# Patient Record
Sex: Male | Born: 1955 | Race: White | Hispanic: No | Marital: Married | Smoking: Never smoker
Health system: Southern US, Community
[De-identification: ages and names within clinical notes are randomized; demographics above are authoritative.]

## PROBLEM LIST (undated history)

## (undated) DIAGNOSIS — I639 Cerebral infarction, unspecified: Secondary | ICD-10-CM

## (undated) DIAGNOSIS — I4892 Unspecified atrial flutter: Secondary | ICD-10-CM

## (undated) DIAGNOSIS — I480 Paroxysmal atrial fibrillation: Secondary | ICD-10-CM

## (undated) DIAGNOSIS — R609 Edema, unspecified: Secondary | ICD-10-CM

## (undated) DIAGNOSIS — I10 Essential (primary) hypertension: Secondary | ICD-10-CM

## (undated) DIAGNOSIS — E785 Hyperlipidemia, unspecified: Secondary | ICD-10-CM

## (undated) DIAGNOSIS — Z953 Presence of xenogenic heart valve: Secondary | ICD-10-CM

## (undated) DIAGNOSIS — Z79899 Other long term (current) drug therapy: Secondary | ICD-10-CM

## (undated) HISTORY — PX: HERNIA REPAIR: SHX51

## (undated) HISTORY — DX: Paroxysmal atrial fibrillation: I48.0

## (undated) HISTORY — PX: TOTAL HIP ARTHROPLASTY: SHX124

## (undated) HISTORY — DX: Other long term (current) drug therapy: Z79.899

## (undated) HISTORY — DX: Essential (primary) hypertension: I10

## (undated) HISTORY — DX: Cerebral infarction, unspecified: I63.9

## (undated) HISTORY — PX: SPLENECTOMY: SUR1306

## (undated) HISTORY — DX: Presence of xenogenic heart valve: Z95.3

## (undated) HISTORY — DX: Hyperlipidemia, unspecified: E78.5

## (undated) HISTORY — PX: APPENDECTOMY: SHX54

## (undated) HISTORY — DX: Unspecified atrial flutter: I48.92

## (undated) HISTORY — PX: COLON SURGERY: SHX602

## (undated) HISTORY — PX: AORTIC VALVE REPLACEMENT: SHX41

## (undated) HISTORY — DX: Edema, unspecified: R60.9

---

## 2008-06-17 ENCOUNTER — Ambulatory Visit: Payer: Self-pay | Admitting: *Deleted

## 2008-06-17 ENCOUNTER — Inpatient Hospital Stay (HOSPITAL_COMMUNITY): Admission: RE | Admit: 2008-06-17 | Discharge: 2008-06-24 | Payer: Self-pay | Admitting: *Deleted

## 2008-06-25 ENCOUNTER — Other Ambulatory Visit (HOSPITAL_COMMUNITY): Admission: RE | Admit: 2008-06-25 | Discharge: 2008-08-28 | Payer: Self-pay | Admitting: Psychiatry

## 2008-06-27 ENCOUNTER — Ambulatory Visit: Payer: Self-pay | Admitting: Psychiatry

## 2008-07-18 ENCOUNTER — Ambulatory Visit: Payer: Self-pay | Admitting: Psychiatry
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
## 2011-08-13 LAB — HEPATIC FUNCTION PANEL
ALT: 57 — ABNORMAL HIGH
AST: 32
Bilirubin, Direct: <0.1
Total Bilirubin: 0.6

## 2011-08-13 LAB — URINE DRUGS OF ABUSE SCREEN W ALC, ROUTINE (REF LAB)
Amphetamine Screen, Ur: NEGATIVE
Barbiturate Quant, Ur: NEGATIVE
Creatinine,U: 157.2
Ethyl Alcohol: <10
Marijuana Metabolite: POSITIVE — AB

## 2011-08-13 LAB — URINALYSIS, ROUTINE W REFLEX MICROSCOPIC
Bilirubin Urine: NEGATIVE
Hgb urine dipstick: NEGATIVE
Nitrite: NEGATIVE
Protein, ur: NEGATIVE
Specific Gravity, Urine: 1.013
Urobilinogen, UA: 0.2

## 2011-08-13 LAB — THC (MARIJUANA), URINE, CONFIRMATION: Marijuana, Ur-Confirmation: 43 ng/mL

## 2011-08-13 LAB — TSH: TSH: 0.562

## 2011-08-13 LAB — RPR: RPR Ser Ql: NONREACTIVE

## 2011-08-16 LAB — URINE DRUGS OF ABUSE SCREEN W ALC, ROUTINE (REF LAB)
Amphetamine Screen, Ur: NEGATIVE
Barbiturate Quant, Ur: NEGATIVE
Creatinine,U: 89.8
Ethyl Alcohol: <10
Marijuana Metabolite: NEGATIVE
Methadone: NEGATIVE

## 2011-08-17 LAB — URINE DRUGS OF ABUSE SCREEN W ALC, ROUTINE (REF LAB)
Amphetamine Screen, Ur: NEGATIVE
Barbiturate Quant, Ur: NEGATIVE
Creatinine,U: 94.3
Ethyl Alcohol: <10
Marijuana Metabolite: NEGATIVE
Phencyclidine (PCP): NEGATIVE

## 2011-08-18 LAB — URINE DRUGS OF ABUSE SCREEN W ALC, ROUTINE (REF LAB)
Barbiturate Quant, Ur: NEGATIVE
Barbiturate Quant, Ur: NEGATIVE
Benzodiazepines.: NEGATIVE
Benzodiazepines.: NEGATIVE
Cocaine Metabolites: NEGATIVE
Ethyl Alcohol: <10
Ethyl Alcohol: <10
Marijuana Metabolite: NEGATIVE
Methadone: NEGATIVE
Methadone: NEGATIVE
Phencyclidine (PCP): NEGATIVE
Phencyclidine (PCP): NEGATIVE
Phencyclidine (PCP): NEGATIVE
Propoxyphene: NEGATIVE

## 2011-08-18 LAB — BENZODIAZEPINE, QUANTITATIVE, URINE
Alprazolam (GC/LC/MS), ur confirm: NEGATIVE
Flurazepam GC/MS Conf: NEGATIVE
Oxazepam GC/MS Conf: NEGATIVE

## 2015-05-07 DIAGNOSIS — I480 Paroxysmal atrial fibrillation: Secondary | ICD-10-CM

## 2015-05-07 DIAGNOSIS — Z953 Presence of xenogenic heart valve: Secondary | ICD-10-CM

## 2015-05-07 HISTORY — DX: Presence of xenogenic heart valve: Z95.3

## 2015-05-07 HISTORY — DX: Paroxysmal atrial fibrillation: I48.0

## 2015-07-10 DIAGNOSIS — Z79899 Other long term (current) drug therapy: Secondary | ICD-10-CM

## 2015-07-10 HISTORY — DX: Other long term (current) drug therapy: Z79.899

## 2016-06-18 DIAGNOSIS — H4601 Optic papillitis, right eye: Secondary | ICD-10-CM | POA: Diagnosis not present

## 2017-07-21 DIAGNOSIS — I4892 Unspecified atrial flutter: Secondary | ICD-10-CM

## 2017-07-21 DIAGNOSIS — E785 Hyperlipidemia, unspecified: Secondary | ICD-10-CM

## 2017-07-21 DIAGNOSIS — I10 Essential (primary) hypertension: Secondary | ICD-10-CM

## 2017-07-21 DIAGNOSIS — I639 Cerebral infarction, unspecified: Secondary | ICD-10-CM

## 2017-07-21 DIAGNOSIS — R609 Edema, unspecified: Secondary | ICD-10-CM

## 2017-07-21 HISTORY — DX: Hyperlipidemia, unspecified: E78.5

## 2017-07-21 HISTORY — DX: Edema, unspecified: R60.9

## 2017-07-21 HISTORY — DX: Unspecified atrial flutter: I48.92

## 2017-07-21 HISTORY — DX: Essential (primary) hypertension: I10

## 2017-07-21 HISTORY — DX: Cerebral infarction, unspecified: I63.9

## 2017-08-03 DIAGNOSIS — I350 Nonrheumatic aortic (valve) stenosis: Secondary | ICD-10-CM | POA: Insufficient documentation

## 2017-08-03 DIAGNOSIS — Q231 Congenital insufficiency of aortic valve: Secondary | ICD-10-CM | POA: Insufficient documentation

## 2017-08-25 ENCOUNTER — Ambulatory Visit: Payer: Self-pay | Admitting: Cardiology

## 2017-08-26 ENCOUNTER — Ambulatory Visit: Payer: Self-pay | Admitting: Cardiology

## 2017-09-06 ENCOUNTER — Other Ambulatory Visit: Payer: Self-pay | Admitting: *Deleted

## 2017-09-16 ENCOUNTER — Ambulatory Visit: Payer: Self-pay | Admitting: Cardiology

## 2017-09-19 ENCOUNTER — Emergency Department (HOSPITAL_COMMUNITY)
Admission: EM | Admit: 2017-09-19 | Discharge: 2017-09-20 | Disposition: A | Discharge status: Home / Self Care | Payer: BLUE CROSS/BLUE SHIELD | Attending: Emergency Medicine | Admitting: Emergency Medicine

## 2017-09-19 ENCOUNTER — Encounter (HOSPITAL_COMMUNITY): Payer: Self-pay

## 2017-09-19 ENCOUNTER — Emergency Department (HOSPITAL_COMMUNITY): Payer: BLUE CROSS/BLUE SHIELD

## 2017-09-19 DIAGNOSIS — Z8673 Personal history of transient ischemic attack (TIA), and cerebral infarction without residual deficits: Secondary | ICD-10-CM | POA: Diagnosis not present

## 2017-09-19 DIAGNOSIS — Z79899 Other long term (current) drug therapy: Secondary | ICD-10-CM | POA: Insufficient documentation

## 2017-09-19 DIAGNOSIS — Z7901 Long term (current) use of anticoagulants: Secondary | ICD-10-CM | POA: Insufficient documentation

## 2017-09-19 DIAGNOSIS — H539 Unspecified visual disturbance: Secondary | ICD-10-CM | POA: Insufficient documentation

## 2017-09-19 DIAGNOSIS — I639 Cerebral infarction, unspecified: Secondary | ICD-10-CM | POA: Diagnosis not present

## 2017-09-19 DIAGNOSIS — I10 Essential (primary) hypertension: Secondary | ICD-10-CM | POA: Insufficient documentation

## 2017-09-19 DIAGNOSIS — H4602 Optic papillitis, left eye: Secondary | ICD-10-CM | POA: Diagnosis not present

## 2017-09-19 LAB — COMPREHENSIVE METABOLIC PANEL
ALBUMIN: 3.5 g/dL (ref 3.5–5.0)
ALK PHOS: 51 U/L (ref 38–126)
ALT: 20 U/L (ref 17–63)
ANION GAP: 8 (ref 5–15)
AST: 29 U/L (ref 15–41)
BILIRUBIN TOTAL: 0.4 mg/dL (ref 0.3–1.2)
BUN: 23 mg/dL — AB (ref 6–20)
CO2: 20 mmol/L — AB (ref 22–32)
Calcium: 8.9 mg/dL (ref 8.9–10.3)
Chloride: 109 mmol/L (ref 101–111)
Creatinine, Ser: 1.61 mg/dL — ABNORMAL HIGH (ref 0.61–1.24)
GFR calc Af Amer: 51 mL/min — ABNORMAL LOW (ref >60)
GFR calc non Af Amer: 44 mL/min — ABNORMAL LOW (ref >60)
GLUCOSE: 164 mg/dL — AB (ref 65–99)
POTASSIUM: 4.3 mmol/L (ref 3.5–5.1)
SODIUM: 137 mmol/L (ref 135–145)
TOTAL PROTEIN: 6.7 g/dL (ref 6.5–8.1)

## 2017-09-19 LAB — CBC
HCT: 36.3 % — ABNORMAL LOW (ref 39.0–52.0)
HEMOGLOBIN: 11.9 g/dL — AB (ref 13.0–17.0)
MCH: 30.6 pg (ref 26.0–34.0)
MCHC: 32.8 g/dL (ref 30.0–36.0)
MCV: 93.3 fL (ref 78.0–100.0)
PLATELETS: 218 10*3/uL (ref 150–400)
RBC: 3.89 MIL/uL — AB (ref 4.22–5.81)
RDW: 14.8 % (ref 11.5–15.5)
WBC: 8.2 10*3/uL (ref 4.0–10.5)

## 2017-09-19 LAB — I-STAT CHEM 8, ED
BUN: 27 mg/dL — AB (ref 6–20)
CALCIUM ION: 1.19 mmol/L (ref 1.15–1.40)
CHLORIDE: 109 mmol/L (ref 101–111)
Creatinine, Ser: 1.7 mg/dL — ABNORMAL HIGH (ref 0.61–1.24)
Glucose, Bld: 169 mg/dL — ABNORMAL HIGH (ref 65–99)
HCT: 36 % — ABNORMAL LOW (ref 39.0–52.0)
Hemoglobin: 12.2 g/dL — ABNORMAL LOW (ref 13.0–17.0)
POTASSIUM: 4.2 mmol/L (ref 3.5–5.1)
SODIUM: 141 mmol/L (ref 135–145)
TCO2: 26 mmol/L (ref 22–32)

## 2017-09-19 LAB — DIFFERENTIAL
BASOS ABS: 0.1 10*3/uL (ref 0.0–0.1)
Basophils Relative: 1 %
EOS ABS: 0.2 10*3/uL (ref 0.0–0.7)
Eosinophils Relative: 2 %
LYMPHS ABS: 1.2 10*3/uL (ref 0.7–4.0)
LYMPHS PCT: 15 %
Monocytes Absolute: 0.4 10*3/uL (ref 0.1–1.0)
Monocytes Relative: 5 %
NEUTROS PCT: 77 %
Neutro Abs: 6.3 10*3/uL (ref 1.7–7.7)

## 2017-09-19 LAB — I-STAT TROPONIN, ED: Troponin i, poc: 0 ng/mL (ref 0.00–0.08)

## 2017-09-19 LAB — APTT: APTT: 37 s — AB (ref 24–36)

## 2017-09-19 LAB — PROTIME-INR
INR: 1.22
PROTHROMBIN TIME: 15.3 s — AB (ref 11.4–15.2)

## 2017-09-19 LAB — CBG MONITORING, ED: GLUCOSE-CAPILLARY: 159 mg/dL — AB (ref 65–99)

## 2017-09-19 NOTE — ED Provider Notes (Signed)
[facility] Provider Note   CSN: [id number] Arrival date & time: 09/19/17  1710     History   Chief Complaint Chief Complaint  Patient presents with  . Visual Field Change    HPI Lawrence Proctor is a 61 y.o. male who presents with left sided peripheral vision loss. PMH significant for A. 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 The patient states that several days ago he noticed his vision was decreased. He was walking his dog and when he looked down he couldn't see the dog. He went to [facility] the other day and kept running in to people because he couldn't see them. He also sees "splotches of color" at times which go away spontaneously. He has had difficulty reading and difficulty driving due to vision loss. He states that he has permanent peripheral and lower vision loss on the right side due to retinal artery occlusion last year. No headache, eye pain, eye redness, drainage. No extremity weakness or facial droop.  HPI  Past Medical History:  Diagnosis Date  . Atrial flutter ([place]) 07/21/2017  . Edema 07/21/2017  . Hyperlipidemia 07/21/2017  . Hypertension 07/21/2017  . On amiodarone therapy 07/10/2015   Overview:  CHADS2 vasc score=1  . Paroxysmal atrial fibrillation ([place]) 05/07/2015  . S/P aortic valve replacement with tissue 05/07/2015  . Stroke (cerebrum) ([place]) 07/21/2017    Patient Active Problem List   Diagnosis Date Noted  . Aortic stenosis, severe 08/03/2017  . Bicuspid aortic valve 08/03/2017  . Atrial flutter ([place]) 07/21/2017  . Edema 07/21/2017  . Hyperlipidemia 07/21/2017  . Hypertension 07/21/2017  . Stroke (cerebrum) ([place]) 07/21/2017  . On amiodarone therapy 07/10/2015  . Paroxysmal atrial fibrillation ([place]) 05/07/2015  . S/P aortic valve replacement with tissue 05/07/2015    Past Surgical History:  Procedure Laterality Date  . AORTIC VALVE REPLACEMENT     tissue   . APPENDECTOMY      . COLON SURGERY    . HERNIA REPAIR    . SPLENECTOMY    . TOTAL HIP ARTHROPLASTY Right        Home Medications    Prior to Admission medications   Medication Sig Start Date End Date Taking? Authorizing Provider  allopurinol (ZYLOPRIM) 300 MG tablet Take 300 mg daily by mouth.    Yes [provider]  amiodarone (PACERONE) 200 MG tablet Take 200 mg daily by mouth.  07/11/17  Yes [provider]  amoxicillin (AMOXIL) 500 MG tablet Take 500 mg 3 (three) times daily by mouth. 09/12/17  Yes [provider]  apixaban (ELIQUIS) 5 MG TABS tablet Take 5 mg 2 (two) times daily by mouth.  07/11/17 07/11/18 Yes [provider]  hydrALAZINE (APRESOLINE) 10 MG tablet Take 10 mg 2 (two) times daily by mouth.    Yes [provider]  lisinopril (PRINIVIL,ZESTRIL) 40 MG tablet Take 40 mg daily by mouth.  04/21/15  Yes [provider]  ofloxacin (OCUFLOX) 0.3 % ophthalmic solution Place 2 drops 4 (four) times daily into the left ear. 09/13/17  Yes [provider]  simvastatin (ZOCOR) 20 MG tablet Take 20 mg daily by mouth.    Yes [provider]  traMADol (ULTRAM) 50 MG tablet Take 50 mg every 6 (six) hours as needed by mouth for moderate pain.    Yes [provider]  traZODone (DESYREL) 50 MG tablet Take 50 mg at bedtime as needed  by mouth for sleep.    Yes [provider]    Family History Family History  Problem Relation Age of Onset  . Stroke Mother   . Heart attack Mother   . Heart attack Father   . Aortic stenosis Brother   . Hypertension Son     Social History Social History   Tobacco Use  . Smoking status: Never Smoker  . Smokeless tobacco: Never Used  Substance Use Topics  . Alcohol use: No  . Drug use: No     Allergies   Lorazepam   Review of Systems Review of Systems  Constitutional: Negative for fever.  Eyes: Positive for visual disturbance. Negative for photophobia, pain, discharge and  redness.  Respiratory: Negative for shortness of breath.   Cardiovascular: Negative for chest pain.  Neurological: Negative for syncope and headaches.  All other systems reviewed and are negative.    Physical Exam Updated Vital Signs BP (!) 149/82 (BP Location: Left Arm)   Pulse (!) 54   Temp 97.8 F (36.6 C) (Oral)   Resp 18   Ht 5\' 5"  (1.651 m)   Wt 81.2 kg (179 lb)   SpO2 98%   BMI 29.79 kg/m   Physical Exam  Constitutional: He is oriented to person, place, and time. He appears well-developed and well-nourished. No distress.  HENT:  Head: Normocephalic and atraumatic.  Eyes: Conjunctivae and EOM are normal. Pupils are equal, round, and reactive to light. Right eye exhibits no discharge. Left eye exhibits no discharge. Right conjunctiva is not injected. Left conjunctiva is not injected. No scleral icterus.  Visual acuity bilaterally: 20/30 Right side: 20/50 Left side: 20/30  Neck: Normal range of motion.  Cardiovascular: Normal rate and regular rhythm. Exam reveals no gallop and no friction rub.  No murmur heard. Pulmonary/Chest: Effort normal and breath sounds normal. No stridor. No respiratory distress. He has no wheezes. He has no rales. He exhibits no tenderness.  Abdominal: He exhibits no distension.  Neurological: He is alert and oriented to person, place, and time.  Skin: Skin is warm and dry.  Psychiatric: He has a normal mood and affect. His behavior is normal.  Nursing note and vitals reviewed.    ED Treatments / Results  Labs (all labs ordered are listed, but only abnormal results are displayed) Labs Reviewed  PROTIME-INR - Abnormal; Notable for the following components:      Result Value   Prothrombin Time 15.3 (*)    All other components within normal limits  APTT - Abnormal; Notable for the following components:   aPTT 37 (*)    All other components within normal limits  CBC - Abnormal; Notable for the following components:   RBC 3.89 (*)     Hemoglobin 11.9 (*)    HCT 36.3 (*)    All other components within normal limits  COMPREHENSIVE METABOLIC PANEL - Abnormal; Notable for the following components:   CO2 20 (*)    Glucose, Bld 164 (*)    BUN 23 (*)    Creatinine, Ser 1.61 (*)    GFR calc non Af Amer 44 (*)    GFR calc Af Amer 51 (*)    All other components within normal limits  CBG MONITORING, ED - Abnormal; Notable for the following components:   Glucose-Capillary 159 (*)    All other components within normal limits  I-STAT CHEM 8, ED - Abnormal; Notable for the following components:   BUN 27 (*)    Creatinine,  Ser 1.70 (*)    Glucose, Bld 169 (*)    Hemoglobin 12.2 (*)    HCT 36.0 (*)    All other components within normal limits  DIFFERENTIAL  I-STAT TROPONIN, ED    EKG  EKG Interpretation  Date/Time:  Monday September 19 2017 17:43:44 EST Ventricular Rate:  62 PR Interval:  196 QRS Duration: 94 QT Interval:  458 QTC Calculation: 464 R Axis:   13 Text Interpretation:  Normal sinus rhythm Cannot rule out Anterior infarct , age undetermined Abnormal ECG No old tracing to compare Confirmed by Ward, Cyril Mourning [id number]) on 09/19/2017 11:31:45 PM       Radiology Ct Head Wo Contrast  Result Date: 09/19/2017 CLINICAL DATA:  61 year old with right eye visual disturbances that began 2 days ago and left eye visual disturbances that began today. Current history of hypertension. EXAM: CT HEAD WITHOUT CONTRAST TECHNIQUE: Contiguous axial images were obtained from the base of the skull through the vertex without intravenous contrast. COMPARISON:  MRI brain 06/18/2016. FINDINGS: Brain: Ventricular system normal in size and appearance for age. No mass lesion. No midline shift. No acute hemorrhage or hematoma. No extra-axial fluid collections. No evidence of acute infarction. No focal brain parenchymal abnormalities. Vascular: Moderate bilateral carotid siphon and bilateral vertebral artery atherosclerosis. No hyperdense vessel.  Skull: No skull fracture or other focal osseous abnormality involving the skull. Sinuses/Orbits: Mucous retention cyst or polyp involving the left maxillary sinus as noted on the prior MRI. Opacification of scattered bilateral ethmoid air cells. Remaining visualized paranasal sinuses, bilateral mastoid air cells and bilateral middle ear cavities well-aerated. Visualized orbits and globes normal in appearance. Other: None. IMPRESSION: No acute intracranial abnormalities. Electronically Signed   By: Evangeline Dakin M.D.   On: 09/19/2017 21:15   Mr Brain Wo Contrast  Result Date: 09/20/2017 CLINICAL DATA:  LEFT eye peripheral vision loss. Assess for stroke. History of hypertension, hyperlipidemia, atrial fibrillation and stroke. EXAM: MRI HEAD AND ORBITS WITHOUT CONTRAST TECHNIQUE: Multiplanar, multiecho pulse sequences of the brain and surrounding structures were obtained without intravenous contrast. Multiplanar, multiecho pulse sequences of the orbits and surrounding structures were obtained including fat saturation techniques, without intravenous contrast administration. COMPARISON:  CT HEAD September 19, 2017 and MRI of the head August 4th 2017 FINDINGS: MRI HEAD FINDINGS BRAIN: No reduced diffusion to suggest acute ischemia. RIGHT parietal and RIGHT cerebellar chronic microhemorrhages. The ventricles and sulci are normal for patient's age. A few scattered supratentorial FLAIR T2 hyperintensities, normal for age. Small focus LEFT parietal encephalomalacia. No suspicious parenchymal signal, mass or mass effect. No abnormal extra-axial fluid collections. VASCULAR: Normal major intracranial vascular flow voids present at skull base. SKULL AND UPPER CERVICAL SPINE: No abnormal sellar expansion. No suspicious calvarial bone marrow signal. Craniocervical junction maintained. OTHER: None. MRI ORBITS FINDINGS ORBITS: Ocular globes are intact with normal signal. Lenses are located. Preservation of the orbital fat.  Normal appearance of the optic nerves and nerve sheaths. Normal symmetric appearance of the extraocular muscles. No intra-ocular mass, signal abnormality. Superior ophthalmic veins are not enlarged. VISUALIZED SINUSES: LEFT mastoid effusion. Small LEFT maxillary mucosal retention cyst. Mild paranasal sinus mucosal thickening without air-fluid levels. SOFT TISSUES: Normal. LIMITED INTRACRANIAL: Normal. IMPRESSION: MRI HEAD: 1. No acute intracranial process. 2. Stable examination including mild chronic small vessel ischemic disease and small old LEFT parietal infarct. MRI ORBITS: 1. Normal noncontrast MRI of the orbits. Electronically Signed   By: Elon Alas M.D.   On: 09/20/2017 03:56   Mr Darnelle Catalan  Wo Contrast  Result Date: 09/20/2017 CLINICAL DATA:  LEFT eye peripheral vision loss. Assess for stroke. History of hypertension, hyperlipidemia, atrial fibrillation and stroke. EXAM: MRI HEAD AND ORBITS WITHOUT CONTRAST TECHNIQUE: Multiplanar, multiecho pulse sequences of the brain and surrounding structures were obtained without intravenous contrast. Multiplanar, multiecho pulse sequences of the orbits and surrounding structures were obtained including fat saturation techniques, without intravenous contrast administration. COMPARISON:  CT HEAD September 19, 2017 and MRI of the head August 4th 2017 FINDINGS: MRI HEAD FINDINGS BRAIN: No reduced diffusion to suggest acute ischemia. RIGHT parietal and RIGHT cerebellar chronic microhemorrhages. The ventricles and sulci are normal for patient's age. A few scattered supratentorial FLAIR T2 hyperintensities, normal for age. Small focus LEFT parietal encephalomalacia. No suspicious parenchymal signal, mass or mass effect. No abnormal extra-axial fluid collections. VASCULAR: Normal major intracranial vascular flow voids present at skull base. SKULL AND UPPER CERVICAL SPINE: No abnormal sellar expansion. No suspicious calvarial bone marrow signal. Craniocervical junction  maintained. OTHER: None. MRI ORBITS FINDINGS ORBITS: Ocular globes are intact with normal signal. Lenses are located. Preservation of the orbital fat. Normal appearance of the optic nerves and nerve sheaths. Normal symmetric appearance of the extraocular muscles. No intra-ocular mass, signal abnormality. Superior ophthalmic veins are not enlarged. VISUALIZED SINUSES: LEFT mastoid effusion. Small LEFT maxillary mucosal retention cyst. Mild paranasal sinus mucosal thickening without air-fluid levels. SOFT TISSUES: Normal. LIMITED INTRACRANIAL: Normal. IMPRESSION: MRI HEAD: 1. No acute intracranial process. 2. Stable examination including mild chronic small vessel ischemic disease and small old LEFT parietal infarct. MRI ORBITS: 1. Normal noncontrast MRI of the orbits. Electronically Signed   By: Elon Alas M.D.   On: 09/20/2017 03:56    Procedures Procedures (including critical care time)  Medications Ordered in ED Medications  apixaban (ELIQUIS) tablet 5 mg (5 mg Oral Given 09/20/17 0031)  lisinopril (PRINIVIL,ZESTRIL) tablet 40 mg (40 mg Oral Given 09/20/17 0031)  hydrALAZINE (APRESOLINE) tablet 10 mg (10 mg Oral Given 09/20/17 0031)     Initial Impression / Assessment and Plan / ED Course  I have reviewed the triage vital signs and the nursing notes.  Pertinent labs & imaging results that were available during my care of the patient were reviewed by me and considered in my medical decision making (see chart for details).  61 year old male who presents with left-sided peripheral vision loss.  Visual acuity is decreased but intact.  He is hypertensive otherwise vital signs are normal.  Discussed with Dr. Leonides Schanz.  Will consult neurology and ophthalmology for recommendations.  Spoke with Dr. Cheral Marker with neurology who recommends MRI of brain and orbits.  Spoke with Dr. Manuella Ghazi with ophthalmology who recommends follow-up in his office in the morning after MRI.  MRI does not show any acute findings.   Discussed results with patient and his wife who is a [employer] scientist and advised him to follow-up with Dr. Manuella Ghazi this morning at 8 AM in his clinic.  The MRI report was printed out for him and directions were provided.  Final Clinical Impressions(s) / ED Diagnoses   Final diagnoses:  Visual disturbance    ED Discharge Orders    None       Recardo Evangelist, PA-C 09/20/17 Elizabeth, Delice Bison, DO 09/20/17 [phone]

## 2017-09-19 NOTE — ED Triage Notes (Signed)
Pt has vision loss in the left eye that began on Sunday. Pt alert and oriented. Sent here by his ophthalmologist for possible stroke. Vision lost is peripheral per opthalmology noted. Pt has hx of afib and currently taking eloquis.

## 2017-09-20 ENCOUNTER — Emergency Department (HOSPITAL_COMMUNITY): Payer: BLUE CROSS/BLUE SHIELD

## 2017-09-20 DIAGNOSIS — H47012 Ischemic optic neuropathy, left eye: Secondary | ICD-10-CM | POA: Diagnosis not present

## 2017-09-20 DIAGNOSIS — I639 Cerebral infarction, unspecified: Secondary | ICD-10-CM | POA: Diagnosis not present

## 2017-09-20 MED ORDER — APIXABAN 5 MG PO TABS
5.0000 mg | ORAL_TABLET | Freq: Two times a day (BID) | ORAL | Status: DC
  Administered 2017-09-20: 5 mg via ORAL
  Filled 2017-09-20 (×2): qty 1

## 2017-09-20 MED ORDER — HYDRALAZINE HCL 10 MG PO TABS
10.0000 mg | ORAL_TABLET | Freq: Once | ORAL | Status: AC
  Administered 2017-09-20: 10 mg via ORAL
  Filled 2017-09-20: qty 1

## 2017-09-20 MED ORDER — LISINOPRIL 20 MG PO TABS
40.0000 mg | ORAL_TABLET | Freq: Once | ORAL | Status: AC
  Administered 2017-09-20: 40 mg via ORAL
  Filled 2017-09-20: qty 2

## 2017-09-20 NOTE — ED Notes (Signed)
MRI called and stated they have 30 mins left on patient on table at this time.  This patient is next on list.

## 2017-09-20 NOTE — ED Notes (Signed)
Patient Alert and oriented X4. Stable and ambulatory. Patient verbalized understanding of the discharge instructions.  Patient belongings were taken by the patient. Follow given regarding appointment with Manuella Ghazi MD at 8am

## 2017-09-20 NOTE — Discharge Instructions (Signed)
Please go to Dr. Trena Platt office this morning at 8AM Please bring your paperwork with you and results of MRI

## 2017-09-24 DIAGNOSIS — I480 Paroxysmal atrial fibrillation: Secondary | ICD-10-CM | POA: Diagnosis not present

## 2017-09-24 DIAGNOSIS — M109 Gout, unspecified: Secondary | ICD-10-CM | POA: Diagnosis not present

## 2017-09-24 DIAGNOSIS — E785 Hyperlipidemia, unspecified: Secondary | ICD-10-CM | POA: Diagnosis not present

## 2017-09-24 DIAGNOSIS — D638 Anemia in other chronic diseases classified elsewhere: Secondary | ICD-10-CM | POA: Diagnosis not present

## 2017-09-24 DIAGNOSIS — I10 Essential (primary) hypertension: Secondary | ICD-10-CM | POA: Diagnosis not present

## 2017-09-24 DIAGNOSIS — Z1331 Encounter for screening for depression: Secondary | ICD-10-CM | POA: Diagnosis not present

## 2017-10-21 DIAGNOSIS — Z953 Presence of xenogenic heart valve: Secondary | ICD-10-CM | POA: Diagnosis not present

## 2017-10-21 DIAGNOSIS — I10 Essential (primary) hypertension: Secondary | ICD-10-CM | POA: Diagnosis not present

## 2017-10-21 DIAGNOSIS — Z7901 Long term (current) use of anticoagulants: Secondary | ICD-10-CM | POA: Diagnosis not present

## 2017-10-21 DIAGNOSIS — I480 Paroxysmal atrial fibrillation: Secondary | ICD-10-CM | POA: Diagnosis not present

## 2017-11-04 DIAGNOSIS — M79604 Pain in right leg: Secondary | ICD-10-CM | POA: Diagnosis not present

## 2017-11-04 DIAGNOSIS — M25551 Pain in right hip: Secondary | ICD-10-CM | POA: Diagnosis not present

## 2017-11-18 DIAGNOSIS — M79651 Pain in right thigh: Secondary | ICD-10-CM | POA: Diagnosis not present

## 2017-11-18 DIAGNOSIS — S72001A Fracture of unspecified part of neck of right femur, initial encounter for closed fracture: Secondary | ICD-10-CM | POA: Diagnosis not present

## 2017-11-18 DIAGNOSIS — S79921A Unspecified injury of right thigh, initial encounter: Secondary | ICD-10-CM | POA: Diagnosis not present

## 2017-11-18 DIAGNOSIS — S3993XA Unspecified injury of pelvis, initial encounter: Secondary | ICD-10-CM | POA: Diagnosis not present

## 2017-11-18 DIAGNOSIS — S7291XA Unspecified fracture of right femur, initial encounter for closed fracture: Secondary | ICD-10-CM | POA: Diagnosis not present

## 2017-11-18 DIAGNOSIS — R102 Pelvic and perineal pain: Secondary | ICD-10-CM | POA: Diagnosis not present

## 2017-11-18 DIAGNOSIS — M25551 Pain in right hip: Secondary | ICD-10-CM | POA: Diagnosis not present

## 2017-11-18 DIAGNOSIS — M898X5 Other specified disorders of bone, thigh: Secondary | ICD-10-CM | POA: Diagnosis not present

## 2017-11-24 DIAGNOSIS — M978XXA Periprosthetic fracture around other internal prosthetic joint, initial encounter: Secondary | ICD-10-CM | POA: Diagnosis not present

## 2017-12-02 DIAGNOSIS — M978XXA Periprosthetic fracture around other internal prosthetic joint, initial encounter: Secondary | ICD-10-CM | POA: Diagnosis not present

## 2017-12-09 DIAGNOSIS — M978XXA Periprosthetic fracture around other internal prosthetic joint, initial encounter: Secondary | ICD-10-CM | POA: Diagnosis not present

## 2017-12-09 DIAGNOSIS — M25551 Pain in right hip: Secondary | ICD-10-CM | POA: Diagnosis not present

## 2017-12-12 DIAGNOSIS — I480 Paroxysmal atrial fibrillation: Secondary | ICD-10-CM | POA: Diagnosis not present

## 2017-12-15 DIAGNOSIS — M978XXA Periprosthetic fracture around other internal prosthetic joint, initial encounter: Secondary | ICD-10-CM | POA: Diagnosis not present

## 2018-01-03 DIAGNOSIS — H47012 Ischemic optic neuropathy, left eye: Secondary | ICD-10-CM | POA: Diagnosis not present

## 2018-01-27 DIAGNOSIS — M978XXA Periprosthetic fracture around other internal prosthetic joint, initial encounter: Secondary | ICD-10-CM | POA: Diagnosis not present

## 2018-03-01 DIAGNOSIS — I480 Paroxysmal atrial fibrillation: Secondary | ICD-10-CM | POA: Diagnosis not present

## 2018-03-01 DIAGNOSIS — Z953 Presence of xenogenic heart valve: Secondary | ICD-10-CM | POA: Diagnosis not present

## 2018-03-01 DIAGNOSIS — E785 Hyperlipidemia, unspecified: Secondary | ICD-10-CM | POA: Diagnosis not present

## 2018-03-01 DIAGNOSIS — Z7901 Long term (current) use of anticoagulants: Secondary | ICD-10-CM | POA: Diagnosis not present

## 2018-03-09 DIAGNOSIS — H40003 Preglaucoma, unspecified, bilateral: Secondary | ICD-10-CM | POA: Diagnosis not present

## 2018-03-09 DIAGNOSIS — H524 Presbyopia: Secondary | ICD-10-CM | POA: Diagnosis not present

## 2018-10-30 IMAGING — MR MR HEAD W/O CM
10 of 14 series · 32 of 48 positions shown · non-contrast
Comparison: CT HEAD September 19, 2017 and MRI of the head Saturday June, 2016

CLINICAL DATA: LEFT eye peripheral vision loss. Assess for stroke.
History of hypertension, hyperlipidemia, atrial fibrillation and
stroke.

EXAM:
MRI HEAD AND ORBITS WITHOUT CONTRAST
TECHNIQUE: Multiplanar, multiecho pulse sequences of the brain and surrounding
structures were obtained without intravenous contrast. Multiplanar,
multiecho pulse sequences of the orbits and surrounding structures
were obtained including fat saturation techniques, without
intravenous contrast administration.

[Series 3: DWI · axial · 3.0mm · 0.94mm/px · z∈[-22,+122]mm · 7 of 100 slices shown (1 of 2)]
[im 1/100]
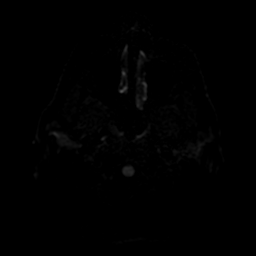
[im 17/100]
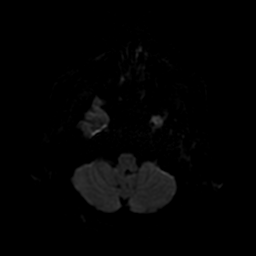
[im 34/100]
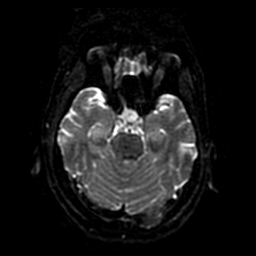
[im 50/100]
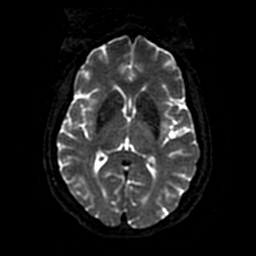
[im 67/100]
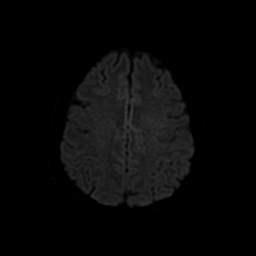
[im 83/100]
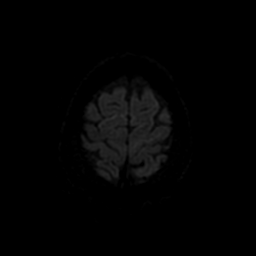
[im 100/100]
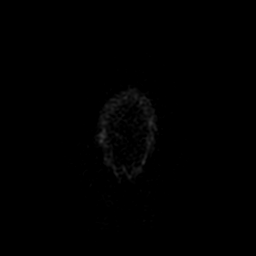

[Series 4: DWI · coronal · 4.0mm · 0.94mm/px · 6 of 72 slices shown (2 of 2)]
[im 1/72]
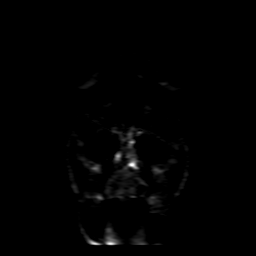
[im 15/72]
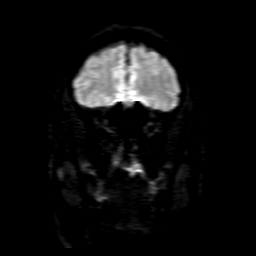
[im 29/72]
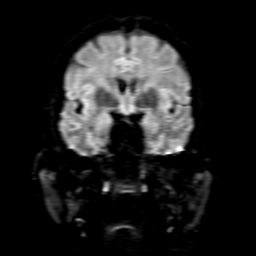
[im 43/72]
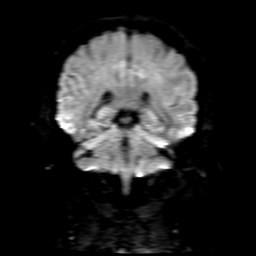
[im 57/72]
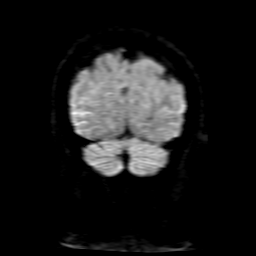
[im 72/72]
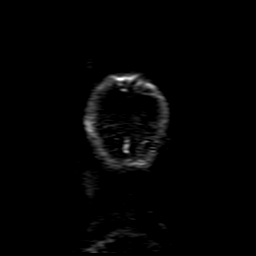

[Series 5: FLAIR · sagittal · 5.0mm · 0.47mm/px · 2 of 25 slices shown (1 of 2)]
[im 1/25]
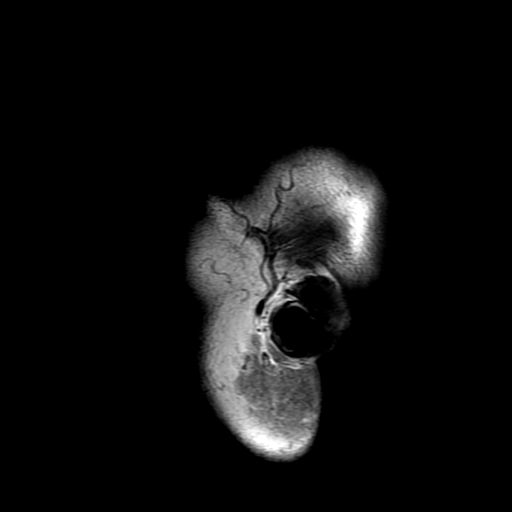
[im 25/25]
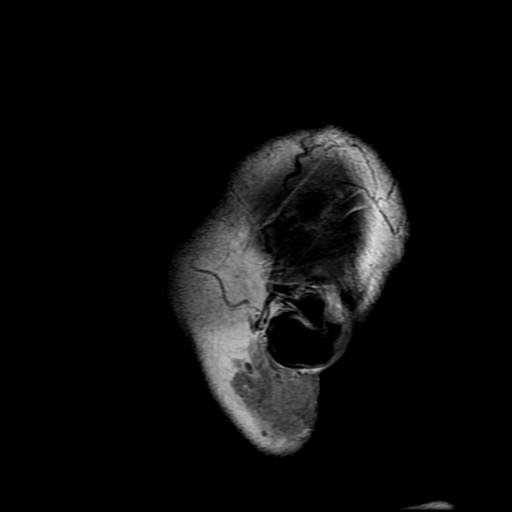

[Series 6: T2 · axial · 5.0mm · 0.43mm/px · z∈[-31,+121]mm · 2 of 27 slices shown (1 of 2)]
[im 1/27]
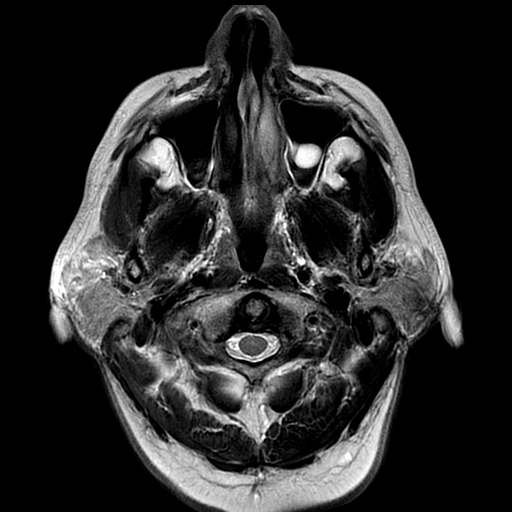
[im 27/27]
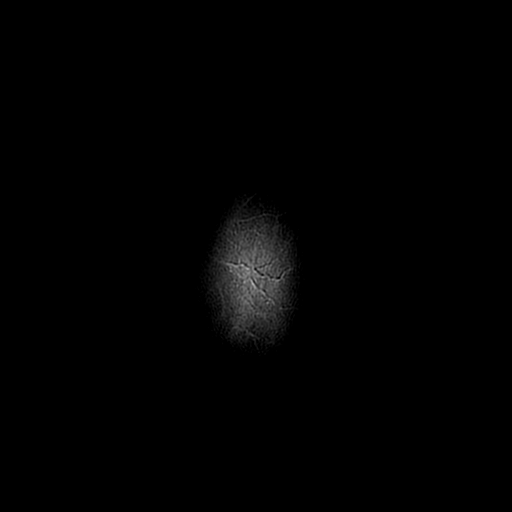

[Series 7: FLAIR · axial · 3.0mm · 0.43mm/px · z∈[-31,+121]mm · 2 of 27 slices shown (2 of 2)]
[im 1/27]
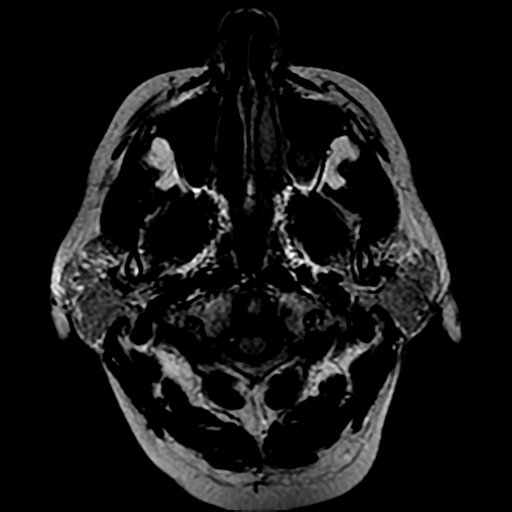
[im 27/27]
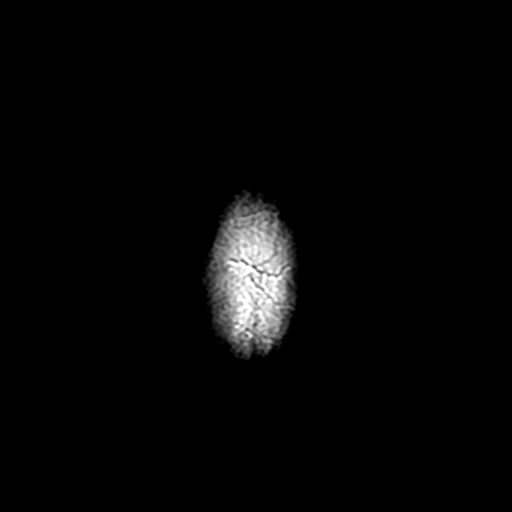

[Series 10: T2 · coronal · 5.0mm · 0.39mm/px · 2 of 30 slices shown (2 of 2)]
[im 1/30]
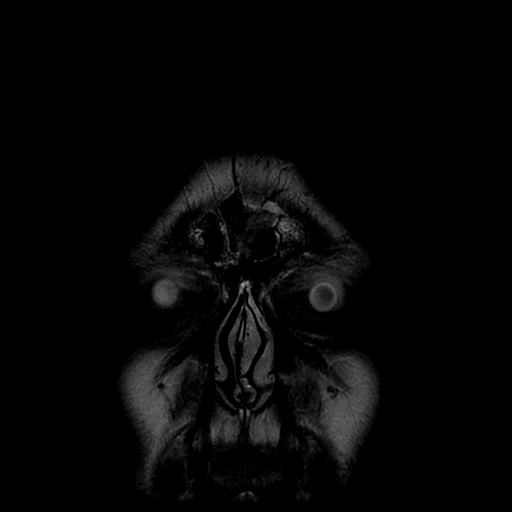
[im 30/30]
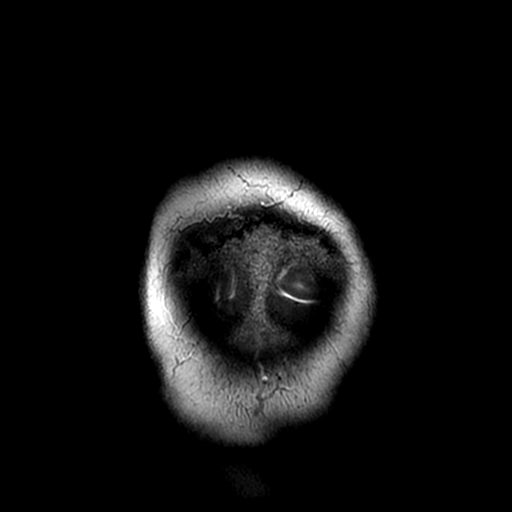

[Series 11: T2 fat-sat · coronal · 4.0mm · 0.35mm/px · 2 of 22 slices shown (1 of 2)]
[im 1/22]
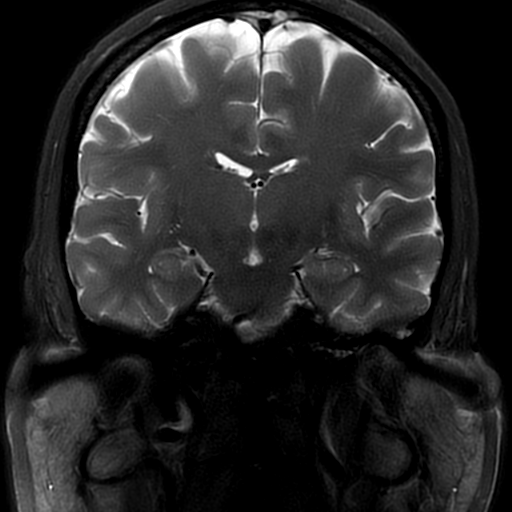
[im 22/22]
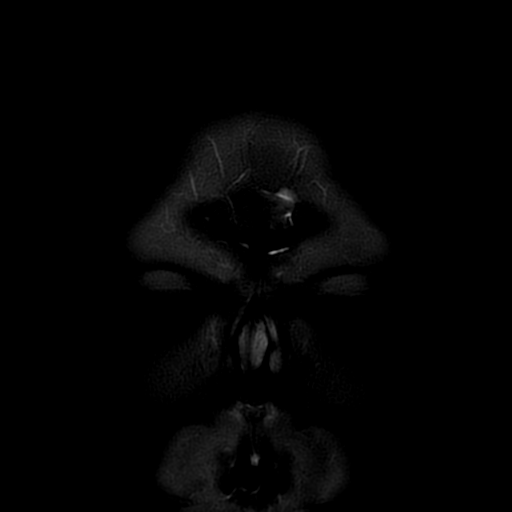

[Series 13: T2 fat-sat · axial · 3.0mm · 0.35mm/px · z∈[-21,+36]mm · 2 of 20 slices shown (2 of 2)]
[im 1/20]
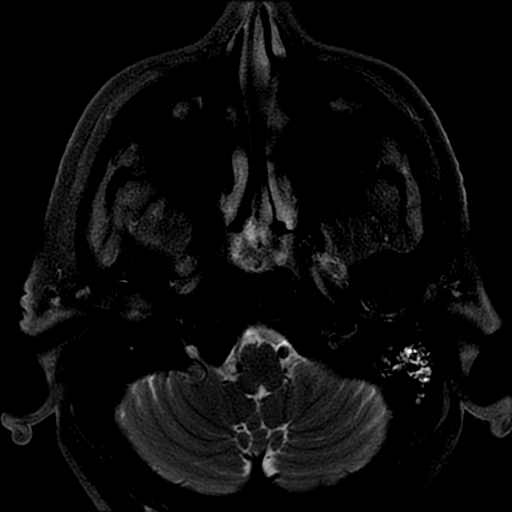
[im 20/20]
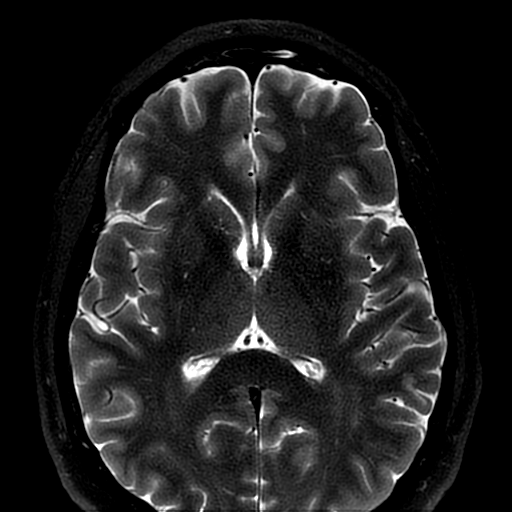

[Series 350: ADC · axial · 3.0mm · 0.94mm/px · z∈[-22,+122]mm · 4 of 50 slices shown (1 of 2)]
[im 1/50]
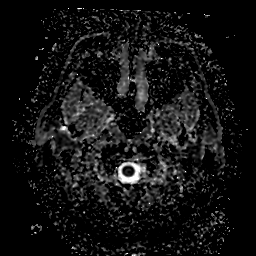
[im 17/50]
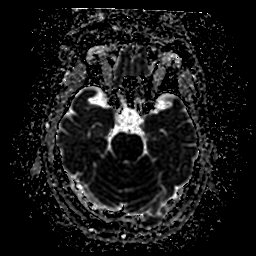
[im 33/50]
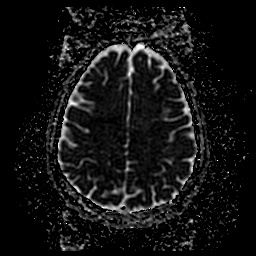
[im 50/50]
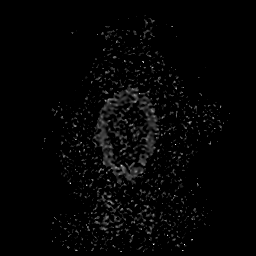

[Series 450: ADC · coronal · 4.0mm · 0.94mm/px · 3 of 36 slices shown (2 of 2)]
[im 1/36]
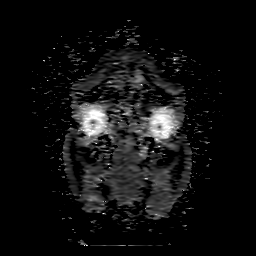
[im 18/36]
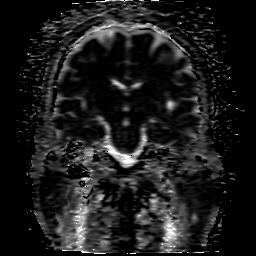
[im 36/36]
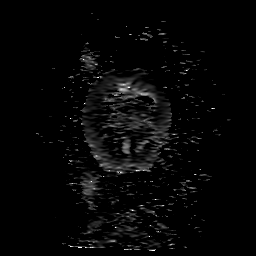

[32 of 48 positions shown; findings below may reference images not displayed]

FINDINGS: MRI HEAD FINDINGS

BRAIN: No reduced diffusion to suggest acute ischemia. RIGHT
parietal and RIGHT cerebellar chronic microhemorrhages. The
ventricles and sulci are normal for patient's age. A few scattered
supratentorial FLAIR T2 hyperintensities, normal for age. Small
focus LEFT parietal encephalomalacia. No suspicious parenchymal
signal, mass or mass effect. No abnormal extra-axial fluid
collections.

VASCULAR: Normal major intracranial vascular flow voids present at
skull base.

SKULL AND UPPER CERVICAL SPINE: No abnormal sellar expansion. No
suspicious calvarial bone marrow signal. Craniocervical junction
maintained.

OTHER: None.

MRI ORBITS FINDINGS

ORBITS: Ocular globes are intact with normal signal. Lenses are
located. Preservation of the orbital fat. Normal appearance of the
optic nerves and nerve sheaths. Normal symmetric appearance of the
extraocular muscles. No intra-ocular mass, signal abnormality.
Superior ophthalmic veins are not enlarged.

VISUALIZED SINUSES: LEFT mastoid effusion. Small LEFT maxillary
mucosal retention cyst. Mild paranasal sinus mucosal thickening
without air-fluid levels.

SOFT TISSUES: Normal.

LIMITED INTRACRANIAL: Normal.
IMPRESSION: MRI HEAD:

1. No acute intracranial process.
2. Stable examination including mild chronic small vessel ischemic
disease and small old LEFT parietal infarct.
MRI ORBITS:

1. Normal noncontrast MRI of the orbits.

## 2019-07-20 DIAGNOSIS — E785 Hyperlipidemia, unspecified: Secondary | ICD-10-CM | POA: Diagnosis not present

## 2019-07-20 DIAGNOSIS — I480 Paroxysmal atrial fibrillation: Secondary | ICD-10-CM | POA: Diagnosis not present

## 2019-07-20 DIAGNOSIS — I10 Essential (primary) hypertension: Secondary | ICD-10-CM | POA: Diagnosis not present

## 2019-07-20 DIAGNOSIS — Z953 Presence of xenogenic heart valve: Secondary | ICD-10-CM | POA: Diagnosis not present

## 2019-08-19 DIAGNOSIS — M79659 Pain in unspecified thigh: Secondary | ICD-10-CM | POA: Diagnosis not present

## 2019-08-19 DIAGNOSIS — M79652 Pain in left thigh: Secondary | ICD-10-CM | POA: Diagnosis not present

## 2019-08-19 DIAGNOSIS — T148XXA Other injury of unspecified body region, initial encounter: Secondary | ICD-10-CM | POA: Diagnosis not present

## 2019-08-19 DIAGNOSIS — S3993XA Unspecified injury of pelvis, initial encounter: Secondary | ICD-10-CM | POA: Diagnosis not present

## 2019-08-19 DIAGNOSIS — S79921A Unspecified injury of right thigh, initial encounter: Secondary | ICD-10-CM | POA: Diagnosis not present

## 2019-08-19 DIAGNOSIS — S0990XA Unspecified injury of head, initial encounter: Secondary | ICD-10-CM | POA: Diagnosis not present

## 2019-08-19 DIAGNOSIS — M79651 Pain in right thigh: Secondary | ICD-10-CM | POA: Diagnosis not present

## 2019-08-19 DIAGNOSIS — Z23 Encounter for immunization: Secondary | ICD-10-CM | POA: Diagnosis not present

## 2019-08-19 DIAGNOSIS — S50312A Abrasion of left elbow, initial encounter: Secondary | ICD-10-CM | POA: Diagnosis not present

## 2019-08-19 DIAGNOSIS — S79922A Unspecified injury of left thigh, initial encounter: Secondary | ICD-10-CM | POA: Diagnosis not present

## 2019-08-19 DIAGNOSIS — W19XXXA Unspecified fall, initial encounter: Secondary | ICD-10-CM | POA: Diagnosis not present

## 2019-09-27 DIAGNOSIS — M25551 Pain in right hip: Secondary | ICD-10-CM | POA: Diagnosis not present

## 2019-10-10 DIAGNOSIS — M25551 Pain in right hip: Secondary | ICD-10-CM | POA: Diagnosis not present

## 2019-11-24 DIAGNOSIS — I480 Paroxysmal atrial fibrillation: Secondary | ICD-10-CM | POA: Diagnosis not present

## 2019-11-24 DIAGNOSIS — I10 Essential (primary) hypertension: Secondary | ICD-10-CM | POA: Diagnosis not present

## 2019-11-24 DIAGNOSIS — D638 Anemia in other chronic diseases classified elsewhere: Secondary | ICD-10-CM | POA: Diagnosis not present

## 2019-11-24 DIAGNOSIS — M109 Gout, unspecified: Secondary | ICD-10-CM | POA: Diagnosis not present

## 2019-11-24 DIAGNOSIS — Z9181 History of falling: Secondary | ICD-10-CM | POA: Diagnosis not present

## 2019-11-24 DIAGNOSIS — N183 Chronic kidney disease, stage 3 unspecified: Secondary | ICD-10-CM | POA: Diagnosis not present

## 2019-11-24 DIAGNOSIS — E785 Hyperlipidemia, unspecified: Secondary | ICD-10-CM | POA: Diagnosis not present

## 2019-12-05 DIAGNOSIS — M25551 Pain in right hip: Secondary | ICD-10-CM | POA: Diagnosis not present

## 2019-12-21 DIAGNOSIS — Z96641 Presence of right artificial hip joint: Secondary | ICD-10-CM | POA: Diagnosis not present

## 2019-12-21 DIAGNOSIS — M25551 Pain in right hip: Secondary | ICD-10-CM | POA: Diagnosis not present

## 2019-12-21 DIAGNOSIS — R948 Abnormal results of function studies of other organs and systems: Secondary | ICD-10-CM | POA: Diagnosis not present

## 2019-12-26 DIAGNOSIS — T84010A Broken internal right hip prosthesis, initial encounter: Secondary | ICD-10-CM | POA: Diagnosis not present

## 2020-01-09 DIAGNOSIS — I10 Essential (primary) hypertension: Secondary | ICD-10-CM | POA: Diagnosis not present

## 2020-01-09 DIAGNOSIS — T84010A Broken internal right hip prosthesis, initial encounter: Secondary | ICD-10-CM | POA: Diagnosis not present

## 2020-01-09 DIAGNOSIS — M109 Gout, unspecified: Secondary | ICD-10-CM | POA: Diagnosis not present

## 2020-01-09 DIAGNOSIS — E785 Hyperlipidemia, unspecified: Secondary | ICD-10-CM | POA: Diagnosis not present

## 2020-01-09 DIAGNOSIS — N183 Chronic kidney disease, stage 3 unspecified: Secondary | ICD-10-CM | POA: Diagnosis not present

## 2020-01-09 DIAGNOSIS — Z96641 Presence of right artificial hip joint: Secondary | ICD-10-CM | POA: Diagnosis not present

## 2020-01-09 DIAGNOSIS — M25451 Effusion, right hip: Secondary | ICD-10-CM | POA: Diagnosis not present

## 2020-01-09 DIAGNOSIS — Z01818 Encounter for other preprocedural examination: Secondary | ICD-10-CM | POA: Diagnosis not present

## 2020-01-15 DIAGNOSIS — Z22322 Carrier or suspected carrier of Methicillin resistant Staphylococcus aureus: Secondary | ICD-10-CM | POA: Diagnosis not present

## 2020-01-15 DIAGNOSIS — Z01812 Encounter for preprocedural laboratory examination: Secondary | ICD-10-CM | POA: Diagnosis not present

## 2020-01-15 DIAGNOSIS — T84010A Broken internal right hip prosthesis, initial encounter: Secondary | ICD-10-CM | POA: Diagnosis not present

## 2020-01-17 DIAGNOSIS — T84010A Broken internal right hip prosthesis, initial encounter: Secondary | ICD-10-CM | POA: Diagnosis not present

## 2020-01-17 DIAGNOSIS — Z01812 Encounter for preprocedural laboratory examination: Secondary | ICD-10-CM | POA: Diagnosis not present

## 2020-01-22 DIAGNOSIS — I480 Paroxysmal atrial fibrillation: Secondary | ICD-10-CM | POA: Diagnosis not present

## 2020-01-22 DIAGNOSIS — Z952 Presence of prosthetic heart valve: Secondary | ICD-10-CM | POA: Diagnosis not present

## 2020-01-22 DIAGNOSIS — T84030A Mechanical loosening of internal right hip prosthetic joint, initial encounter: Secondary | ICD-10-CM | POA: Diagnosis not present

## 2020-01-22 DIAGNOSIS — R2681 Unsteadiness on feet: Secondary | ICD-10-CM | POA: Diagnosis not present

## 2020-01-22 DIAGNOSIS — T84090A Other mechanical complication of internal right hip prosthesis, initial encounter: Secondary | ICD-10-CM | POA: Diagnosis not present

## 2020-01-22 DIAGNOSIS — Z7901 Long term (current) use of anticoagulants: Secondary | ICD-10-CM | POA: Diagnosis not present

## 2020-01-22 DIAGNOSIS — Z96641 Presence of right artificial hip joint: Secondary | ICD-10-CM | POA: Diagnosis not present

## 2020-01-22 DIAGNOSIS — Z79899 Other long term (current) drug therapy: Secondary | ICD-10-CM | POA: Diagnosis not present

## 2020-01-22 DIAGNOSIS — I4892 Unspecified atrial flutter: Secondary | ICD-10-CM | POA: Diagnosis not present

## 2020-01-22 DIAGNOSIS — Z471 Aftercare following joint replacement surgery: Secondary | ICD-10-CM | POA: Diagnosis not present

## 2020-01-22 DIAGNOSIS — T5691XA Toxic effect of unspecified metal, accidental (unintentional), initial encounter: Secondary | ICD-10-CM | POA: Diagnosis not present

## 2020-01-23 DIAGNOSIS — I480 Paroxysmal atrial fibrillation: Secondary | ICD-10-CM | POA: Diagnosis not present

## 2020-01-23 DIAGNOSIS — Z7901 Long term (current) use of anticoagulants: Secondary | ICD-10-CM | POA: Diagnosis not present

## 2020-01-23 DIAGNOSIS — I4892 Unspecified atrial flutter: Secondary | ICD-10-CM | POA: Diagnosis not present

## 2020-01-23 DIAGNOSIS — T84090A Other mechanical complication of internal right hip prosthesis, initial encounter: Secondary | ICD-10-CM | POA: Diagnosis not present

## 2020-01-23 DIAGNOSIS — Z79899 Other long term (current) drug therapy: Secondary | ICD-10-CM | POA: Diagnosis not present

## 2020-01-23 DIAGNOSIS — R2681 Unsteadiness on feet: Secondary | ICD-10-CM | POA: Diagnosis not present

## 2020-01-23 DIAGNOSIS — Z952 Presence of prosthetic heart valve: Secondary | ICD-10-CM | POA: Diagnosis not present

## 2020-01-24 DIAGNOSIS — T84090A Other mechanical complication of internal right hip prosthesis, initial encounter: Secondary | ICD-10-CM | POA: Diagnosis not present

## 2020-01-24 DIAGNOSIS — I4892 Unspecified atrial flutter: Secondary | ICD-10-CM | POA: Diagnosis not present

## 2020-01-24 DIAGNOSIS — R2681 Unsteadiness on feet: Secondary | ICD-10-CM | POA: Diagnosis not present

## 2020-01-24 DIAGNOSIS — Z79899 Other long term (current) drug therapy: Secondary | ICD-10-CM | POA: Diagnosis not present

## 2020-01-24 DIAGNOSIS — Z952 Presence of prosthetic heart valve: Secondary | ICD-10-CM | POA: Diagnosis not present

## 2020-01-24 DIAGNOSIS — Z7901 Long term (current) use of anticoagulants: Secondary | ICD-10-CM | POA: Diagnosis not present

## 2020-01-24 DIAGNOSIS — I480 Paroxysmal atrial fibrillation: Secondary | ICD-10-CM | POA: Diagnosis not present

## 2020-01-26 DIAGNOSIS — Z952 Presence of prosthetic heart valve: Secondary | ICD-10-CM | POA: Diagnosis not present

## 2020-01-26 DIAGNOSIS — Z96649 Presence of unspecified artificial hip joint: Secondary | ICD-10-CM | POA: Diagnosis not present

## 2020-01-26 DIAGNOSIS — I480 Paroxysmal atrial fibrillation: Secondary | ICD-10-CM | POA: Diagnosis not present

## 2020-01-26 DIAGNOSIS — Z7901 Long term (current) use of anticoagulants: Secondary | ICD-10-CM | POA: Diagnosis not present

## 2020-01-26 DIAGNOSIS — I10 Essential (primary) hypertension: Secondary | ICD-10-CM | POA: Diagnosis not present

## 2020-01-26 DIAGNOSIS — T84010D Broken internal right hip prosthesis, subsequent encounter: Secondary | ICD-10-CM | POA: Diagnosis not present

## 2020-01-26 DIAGNOSIS — I350 Nonrheumatic aortic (valve) stenosis: Secondary | ICD-10-CM | POA: Diagnosis not present

## 2020-01-29 DIAGNOSIS — I480 Paroxysmal atrial fibrillation: Secondary | ICD-10-CM | POA: Diagnosis not present

## 2020-01-29 DIAGNOSIS — T84010D Broken internal right hip prosthesis, subsequent encounter: Secondary | ICD-10-CM | POA: Diagnosis not present

## 2020-01-29 DIAGNOSIS — Z7901 Long term (current) use of anticoagulants: Secondary | ICD-10-CM | POA: Diagnosis not present

## 2020-01-29 DIAGNOSIS — I10 Essential (primary) hypertension: Secondary | ICD-10-CM | POA: Diagnosis not present

## 2020-01-29 DIAGNOSIS — I350 Nonrheumatic aortic (valve) stenosis: Secondary | ICD-10-CM | POA: Diagnosis not present

## 2020-01-29 DIAGNOSIS — Z96649 Presence of unspecified artificial hip joint: Secondary | ICD-10-CM | POA: Diagnosis not present

## 2020-01-29 DIAGNOSIS — Z952 Presence of prosthetic heart valve: Secondary | ICD-10-CM | POA: Diagnosis not present

## 2020-01-31 DIAGNOSIS — T84010D Broken internal right hip prosthesis, subsequent encounter: Secondary | ICD-10-CM | POA: Diagnosis not present

## 2020-01-31 DIAGNOSIS — Z7901 Long term (current) use of anticoagulants: Secondary | ICD-10-CM | POA: Diagnosis not present

## 2020-01-31 DIAGNOSIS — Z96649 Presence of unspecified artificial hip joint: Secondary | ICD-10-CM | POA: Diagnosis not present

## 2020-01-31 DIAGNOSIS — I10 Essential (primary) hypertension: Secondary | ICD-10-CM | POA: Diagnosis not present

## 2020-01-31 DIAGNOSIS — I480 Paroxysmal atrial fibrillation: Secondary | ICD-10-CM | POA: Diagnosis not present

## 2020-01-31 DIAGNOSIS — I350 Nonrheumatic aortic (valve) stenosis: Secondary | ICD-10-CM | POA: Diagnosis not present

## 2020-01-31 DIAGNOSIS — Z952 Presence of prosthetic heart valve: Secondary | ICD-10-CM | POA: Diagnosis not present

## 2020-02-01 DIAGNOSIS — I10 Essential (primary) hypertension: Secondary | ICD-10-CM | POA: Diagnosis not present

## 2020-02-01 DIAGNOSIS — I480 Paroxysmal atrial fibrillation: Secondary | ICD-10-CM | POA: Diagnosis not present

## 2020-02-01 DIAGNOSIS — T84010D Broken internal right hip prosthesis, subsequent encounter: Secondary | ICD-10-CM | POA: Diagnosis not present

## 2020-02-01 DIAGNOSIS — Z7901 Long term (current) use of anticoagulants: Secondary | ICD-10-CM | POA: Diagnosis not present

## 2020-02-01 DIAGNOSIS — I350 Nonrheumatic aortic (valve) stenosis: Secondary | ICD-10-CM | POA: Diagnosis not present

## 2020-02-01 DIAGNOSIS — Z96649 Presence of unspecified artificial hip joint: Secondary | ICD-10-CM | POA: Diagnosis not present

## 2020-02-01 DIAGNOSIS — Z952 Presence of prosthetic heart valve: Secondary | ICD-10-CM | POA: Diagnosis not present

## 2020-02-04 DIAGNOSIS — I480 Paroxysmal atrial fibrillation: Secondary | ICD-10-CM | POA: Diagnosis not present

## 2020-02-04 DIAGNOSIS — I10 Essential (primary) hypertension: Secondary | ICD-10-CM | POA: Diagnosis not present

## 2020-02-04 DIAGNOSIS — I350 Nonrheumatic aortic (valve) stenosis: Secondary | ICD-10-CM | POA: Diagnosis not present

## 2020-02-04 DIAGNOSIS — Z96649 Presence of unspecified artificial hip joint: Secondary | ICD-10-CM | POA: Diagnosis not present

## 2020-02-04 DIAGNOSIS — Z7901 Long term (current) use of anticoagulants: Secondary | ICD-10-CM | POA: Diagnosis not present

## 2020-02-04 DIAGNOSIS — Z952 Presence of prosthetic heart valve: Secondary | ICD-10-CM | POA: Diagnosis not present

## 2020-02-04 DIAGNOSIS — T84010D Broken internal right hip prosthesis, subsequent encounter: Secondary | ICD-10-CM | POA: Diagnosis not present

## 2020-02-06 DIAGNOSIS — Z952 Presence of prosthetic heart valve: Secondary | ICD-10-CM | POA: Diagnosis not present

## 2020-02-06 DIAGNOSIS — Z7901 Long term (current) use of anticoagulants: Secondary | ICD-10-CM | POA: Diagnosis not present

## 2020-02-06 DIAGNOSIS — I10 Essential (primary) hypertension: Secondary | ICD-10-CM | POA: Diagnosis not present

## 2020-02-06 DIAGNOSIS — I350 Nonrheumatic aortic (valve) stenosis: Secondary | ICD-10-CM | POA: Diagnosis not present

## 2020-02-06 DIAGNOSIS — I480 Paroxysmal atrial fibrillation: Secondary | ICD-10-CM | POA: Diagnosis not present

## 2020-02-06 DIAGNOSIS — Z96649 Presence of unspecified artificial hip joint: Secondary | ICD-10-CM | POA: Diagnosis not present

## 2020-02-06 DIAGNOSIS — T84010D Broken internal right hip prosthesis, subsequent encounter: Secondary | ICD-10-CM | POA: Diagnosis not present

## 2020-02-09 DIAGNOSIS — Z952 Presence of prosthetic heart valve: Secondary | ICD-10-CM | POA: Diagnosis not present

## 2020-02-09 DIAGNOSIS — I10 Essential (primary) hypertension: Secondary | ICD-10-CM | POA: Diagnosis not present

## 2020-02-09 DIAGNOSIS — T84010D Broken internal right hip prosthesis, subsequent encounter: Secondary | ICD-10-CM | POA: Diagnosis not present

## 2020-02-09 DIAGNOSIS — I480 Paroxysmal atrial fibrillation: Secondary | ICD-10-CM | POA: Diagnosis not present

## 2020-02-09 DIAGNOSIS — Z96649 Presence of unspecified artificial hip joint: Secondary | ICD-10-CM | POA: Diagnosis not present

## 2020-02-09 DIAGNOSIS — I350 Nonrheumatic aortic (valve) stenosis: Secondary | ICD-10-CM | POA: Diagnosis not present

## 2020-02-09 DIAGNOSIS — Z7901 Long term (current) use of anticoagulants: Secondary | ICD-10-CM | POA: Diagnosis not present

## 2020-02-11 DIAGNOSIS — M1611 Unilateral primary osteoarthritis, right hip: Secondary | ICD-10-CM | POA: Diagnosis not present

## 2020-02-11 DIAGNOSIS — Z96641 Presence of right artificial hip joint: Secondary | ICD-10-CM | POA: Diagnosis not present

## 2020-02-11 DIAGNOSIS — M25551 Pain in right hip: Secondary | ICD-10-CM | POA: Diagnosis not present

## 2020-04-10 DIAGNOSIS — H47011 Ischemic optic neuropathy, right eye: Secondary | ICD-10-CM | POA: Diagnosis not present

## 2020-04-10 DIAGNOSIS — H524 Presbyopia: Secondary | ICD-10-CM | POA: Diagnosis not present

## 2020-04-10 DIAGNOSIS — H47012 Ischemic optic neuropathy, left eye: Secondary | ICD-10-CM | POA: Diagnosis not present

## 2020-04-10 DIAGNOSIS — H40003 Preglaucoma, unspecified, bilateral: Secondary | ICD-10-CM | POA: Diagnosis not present

## 2020-04-23 DIAGNOSIS — H40003 Preglaucoma, unspecified, bilateral: Secondary | ICD-10-CM | POA: Diagnosis not present

## 2020-05-24 DIAGNOSIS — E785 Hyperlipidemia, unspecified: Secondary | ICD-10-CM | POA: Diagnosis not present

## 2020-05-24 DIAGNOSIS — I480 Paroxysmal atrial fibrillation: Secondary | ICD-10-CM | POA: Diagnosis not present

## 2020-05-24 DIAGNOSIS — M109 Gout, unspecified: Secondary | ICD-10-CM | POA: Diagnosis not present

## 2020-05-24 DIAGNOSIS — I10 Essential (primary) hypertension: Secondary | ICD-10-CM | POA: Diagnosis not present

## 2020-05-24 DIAGNOSIS — D638 Anemia in other chronic diseases classified elsewhere: Secondary | ICD-10-CM | POA: Diagnosis not present

## 2020-05-24 DIAGNOSIS — N183 Chronic kidney disease, stage 3 unspecified: Secondary | ICD-10-CM | POA: Diagnosis not present

## 2020-07-23 DIAGNOSIS — Z953 Presence of xenogenic heart valve: Secondary | ICD-10-CM | POA: Diagnosis not present

## 2020-07-23 DIAGNOSIS — I10 Essential (primary) hypertension: Secondary | ICD-10-CM | POA: Diagnosis not present

## 2020-07-23 DIAGNOSIS — Z7901 Long term (current) use of anticoagulants: Secondary | ICD-10-CM | POA: Diagnosis not present

## 2020-07-23 DIAGNOSIS — I480 Paroxysmal atrial fibrillation: Secondary | ICD-10-CM | POA: Diagnosis not present

## 2020-07-23 DIAGNOSIS — E785 Hyperlipidemia, unspecified: Secondary | ICD-10-CM | POA: Diagnosis not present

## 2020-08-12 DIAGNOSIS — Z953 Presence of xenogenic heart valve: Secondary | ICD-10-CM | POA: Diagnosis not present

## 2020-08-12 DIAGNOSIS — I10 Essential (primary) hypertension: Secondary | ICD-10-CM | POA: Diagnosis not present

## 2020-10-30 DIAGNOSIS — H5340 Unspecified visual field defects: Secondary | ICD-10-CM | POA: Diagnosis not present

## 2020-10-30 DIAGNOSIS — H47011 Ischemic optic neuropathy, right eye: Secondary | ICD-10-CM | POA: Diagnosis not present

## 2020-10-30 DIAGNOSIS — H47012 Ischemic optic neuropathy, left eye: Secondary | ICD-10-CM | POA: Diagnosis not present

## 2020-11-12 DIAGNOSIS — Z139 Encounter for screening, unspecified: Secondary | ICD-10-CM | POA: Diagnosis not present

## 2020-11-12 DIAGNOSIS — E785 Hyperlipidemia, unspecified: Secondary | ICD-10-CM | POA: Diagnosis not present

## 2020-11-12 DIAGNOSIS — Z9181 History of falling: Secondary | ICD-10-CM | POA: Diagnosis not present

## 2020-11-12 DIAGNOSIS — Z0000 Encounter for general adult medical examination without abnormal findings: Secondary | ICD-10-CM | POA: Diagnosis not present

## 2020-12-15 DIAGNOSIS — I10 Essential (primary) hypertension: Secondary | ICD-10-CM | POA: Diagnosis not present

## 2020-12-15 DIAGNOSIS — I480 Paroxysmal atrial fibrillation: Secondary | ICD-10-CM | POA: Diagnosis not present

## 2020-12-15 DIAGNOSIS — D638 Anemia in other chronic diseases classified elsewhere: Secondary | ICD-10-CM | POA: Diagnosis not present

## 2020-12-15 DIAGNOSIS — L989 Disorder of the skin and subcutaneous tissue, unspecified: Secondary | ICD-10-CM | POA: Diagnosis not present

## 2020-12-15 DIAGNOSIS — E785 Hyperlipidemia, unspecified: Secondary | ICD-10-CM | POA: Diagnosis not present

## 2020-12-15 DIAGNOSIS — M109 Gout, unspecified: Secondary | ICD-10-CM | POA: Diagnosis not present

## 2020-12-15 DIAGNOSIS — N183 Chronic kidney disease, stage 3 unspecified: Secondary | ICD-10-CM | POA: Diagnosis not present

## 2021-01-20 DIAGNOSIS — M79661 Pain in right lower leg: Secondary | ICD-10-CM | POA: Diagnosis not present

## 2021-01-20 DIAGNOSIS — Z298 Encounter for other specified prophylactic measures: Secondary | ICD-10-CM | POA: Diagnosis not present

## 2021-01-20 DIAGNOSIS — M898X6 Other specified disorders of bone, lower leg: Secondary | ICD-10-CM | POA: Diagnosis not present

## 2021-03-25 DIAGNOSIS — Z86010 Personal history of colonic polyps: Secondary | ICD-10-CM | POA: Diagnosis not present

## 2021-03-25 DIAGNOSIS — Z1211 Encounter for screening for malignant neoplasm of colon: Secondary | ICD-10-CM | POA: Diagnosis not present

## 2021-03-25 DIAGNOSIS — D124 Benign neoplasm of descending colon: Secondary | ICD-10-CM | POA: Diagnosis not present

## 2021-03-25 DIAGNOSIS — K635 Polyp of colon: Secondary | ICD-10-CM | POA: Diagnosis not present

## 2021-03-25 DIAGNOSIS — D125 Benign neoplasm of sigmoid colon: Secondary | ICD-10-CM | POA: Diagnosis not present

## 2021-03-25 DIAGNOSIS — D129 Benign neoplasm of anus and anal canal: Secondary | ICD-10-CM | POA: Diagnosis not present

## 2021-04-21 DIAGNOSIS — S8012XA Contusion of left lower leg, initial encounter: Secondary | ICD-10-CM | POA: Diagnosis not present

## 2021-04-21 DIAGNOSIS — Z23 Encounter for immunization: Secondary | ICD-10-CM | POA: Diagnosis not present

## 2021-04-21 DIAGNOSIS — S81802A Unspecified open wound, left lower leg, initial encounter: Secondary | ICD-10-CM | POA: Diagnosis not present

## 2021-05-07 DIAGNOSIS — H47011 Ischemic optic neuropathy, right eye: Secondary | ICD-10-CM | POA: Diagnosis not present

## 2021-05-07 DIAGNOSIS — H25813 Combined forms of age-related cataract, bilateral: Secondary | ICD-10-CM | POA: Diagnosis not present

## 2021-06-12 DIAGNOSIS — N183 Chronic kidney disease, stage 3 unspecified: Secondary | ICD-10-CM | POA: Diagnosis not present

## 2021-06-12 DIAGNOSIS — I700 Atherosclerosis of aorta: Secondary | ICD-10-CM | POA: Diagnosis not present

## 2021-06-12 DIAGNOSIS — I129 Hypertensive chronic kidney disease with stage 1 through stage 4 chronic kidney disease, or unspecified chronic kidney disease: Secondary | ICD-10-CM | POA: Diagnosis not present

## 2021-06-12 DIAGNOSIS — E785 Hyperlipidemia, unspecified: Secondary | ICD-10-CM | POA: Diagnosis not present

## 2021-06-12 DIAGNOSIS — M109 Gout, unspecified: Secondary | ICD-10-CM | POA: Diagnosis not present

## 2021-06-12 DIAGNOSIS — I480 Paroxysmal atrial fibrillation: Secondary | ICD-10-CM | POA: Diagnosis not present

## 2021-06-12 DIAGNOSIS — D638 Anemia in other chronic diseases classified elsewhere: Secondary | ICD-10-CM | POA: Diagnosis not present

## 2021-07-24 DIAGNOSIS — Z7901 Long term (current) use of anticoagulants: Secondary | ICD-10-CM | POA: Diagnosis not present

## 2021-07-24 DIAGNOSIS — I480 Paroxysmal atrial fibrillation: Secondary | ICD-10-CM | POA: Diagnosis not present

## 2021-07-24 DIAGNOSIS — I10 Essential (primary) hypertension: Secondary | ICD-10-CM | POA: Diagnosis not present

## 2021-07-24 DIAGNOSIS — Z953 Presence of xenogenic heart valve: Secondary | ICD-10-CM | POA: Diagnosis not present

## 2021-07-24 DIAGNOSIS — E785 Hyperlipidemia, unspecified: Secondary | ICD-10-CM | POA: Diagnosis not present

## 2021-08-12 DIAGNOSIS — I471 Supraventricular tachycardia: Secondary | ICD-10-CM | POA: Diagnosis not present

## 2021-08-12 DIAGNOSIS — I480 Paroxysmal atrial fibrillation: Secondary | ICD-10-CM | POA: Diagnosis not present

## 2021-12-16 DIAGNOSIS — D638 Anemia in other chronic diseases classified elsewhere: Secondary | ICD-10-CM | POA: Diagnosis not present

## 2021-12-16 DIAGNOSIS — M109 Gout, unspecified: Secondary | ICD-10-CM | POA: Diagnosis not present

## 2021-12-16 DIAGNOSIS — N183 Chronic kidney disease, stage 3 unspecified: Secondary | ICD-10-CM | POA: Diagnosis not present

## 2021-12-16 DIAGNOSIS — E785 Hyperlipidemia, unspecified: Secondary | ICD-10-CM | POA: Diagnosis not present

## 2021-12-16 DIAGNOSIS — I129 Hypertensive chronic kidney disease with stage 1 through stage 4 chronic kidney disease, or unspecified chronic kidney disease: Secondary | ICD-10-CM | POA: Diagnosis not present

## 2021-12-16 DIAGNOSIS — I480 Paroxysmal atrial fibrillation: Secondary | ICD-10-CM | POA: Diagnosis not present

## 2022-01-11 DIAGNOSIS — H47012 Ischemic optic neuropathy, left eye: Secondary | ICD-10-CM | POA: Diagnosis not present

## 2022-01-11 DIAGNOSIS — H47011 Ischemic optic neuropathy, right eye: Secondary | ICD-10-CM | POA: Diagnosis not present

## 2022-06-08 DIAGNOSIS — R079 Chest pain, unspecified: Secondary | ICD-10-CM | POA: Diagnosis not present

## 2022-06-08 DIAGNOSIS — R531 Weakness: Secondary | ICD-10-CM | POA: Diagnosis not present

## 2022-06-08 DIAGNOSIS — N183 Chronic kidney disease, stage 3 unspecified: Secondary | ICD-10-CM | POA: Diagnosis not present

## 2022-06-08 DIAGNOSIS — E785 Hyperlipidemia, unspecified: Secondary | ICD-10-CM | POA: Diagnosis not present

## 2022-06-08 DIAGNOSIS — M109 Gout, unspecified: Secondary | ICD-10-CM | POA: Diagnosis not present

## 2022-06-08 DIAGNOSIS — I480 Paroxysmal atrial fibrillation: Secondary | ICD-10-CM | POA: Diagnosis not present

## 2022-06-08 DIAGNOSIS — I129 Hypertensive chronic kidney disease with stage 1 through stage 4 chronic kidney disease, or unspecified chronic kidney disease: Secondary | ICD-10-CM | POA: Diagnosis not present

## 2022-06-08 DIAGNOSIS — E538 Deficiency of other specified B group vitamins: Secondary | ICD-10-CM | POA: Diagnosis not present

## 2022-06-08 DIAGNOSIS — D638 Anemia in other chronic diseases classified elsewhere: Secondary | ICD-10-CM | POA: Diagnosis not present

## 2022-06-09 DIAGNOSIS — R778 Other specified abnormalities of plasma proteins: Secondary | ICD-10-CM | POA: Diagnosis not present

## 2022-06-09 DIAGNOSIS — R079 Chest pain, unspecified: Secondary | ICD-10-CM | POA: Diagnosis not present

## 2022-06-09 DIAGNOSIS — R9431 Abnormal electrocardiogram [ECG] [EKG]: Secondary | ICD-10-CM | POA: Diagnosis not present

## 2022-06-09 DIAGNOSIS — Z952 Presence of prosthetic heart valve: Secondary | ICD-10-CM | POA: Diagnosis not present

## 2022-06-09 DIAGNOSIS — E785 Hyperlipidemia, unspecified: Secondary | ICD-10-CM | POA: Diagnosis not present

## 2022-06-09 DIAGNOSIS — I10 Essential (primary) hypertension: Secondary | ICD-10-CM | POA: Diagnosis not present

## 2022-06-09 DIAGNOSIS — I088 Other rheumatic multiple valve diseases: Secondary | ICD-10-CM | POA: Diagnosis not present

## 2022-06-09 DIAGNOSIS — R0789 Other chest pain: Secondary | ICD-10-CM | POA: Diagnosis not present

## 2022-06-09 DIAGNOSIS — Z79899 Other long term (current) drug therapy: Secondary | ICD-10-CM | POA: Diagnosis not present

## 2022-06-09 DIAGNOSIS — Z8679 Personal history of other diseases of the circulatory system: Secondary | ICD-10-CM | POA: Diagnosis not present

## 2022-06-11 DIAGNOSIS — Z952 Presence of prosthetic heart valve: Secondary | ICD-10-CM | POA: Diagnosis not present

## 2022-06-11 DIAGNOSIS — R778 Other specified abnormalities of plasma proteins: Secondary | ICD-10-CM | POA: Diagnosis not present

## 2022-06-11 DIAGNOSIS — I129 Hypertensive chronic kidney disease with stage 1 through stage 4 chronic kidney disease, or unspecified chronic kidney disease: Secondary | ICD-10-CM | POA: Diagnosis not present

## 2022-06-11 DIAGNOSIS — R079 Chest pain, unspecified: Secondary | ICD-10-CM | POA: Diagnosis not present

## 2022-06-11 DIAGNOSIS — I480 Paroxysmal atrial fibrillation: Secondary | ICD-10-CM | POA: Diagnosis not present

## 2022-06-14 DIAGNOSIS — I2510 Atherosclerotic heart disease of native coronary artery without angina pectoris: Secondary | ICD-10-CM | POA: Diagnosis not present

## 2022-06-14 DIAGNOSIS — R531 Weakness: Secondary | ICD-10-CM | POA: Diagnosis not present

## 2022-06-14 DIAGNOSIS — Z8679 Personal history of other diseases of the circulatory system: Secondary | ICD-10-CM | POA: Diagnosis not present

## 2022-06-14 DIAGNOSIS — I480 Paroxysmal atrial fibrillation: Secondary | ICD-10-CM | POA: Diagnosis not present

## 2022-06-14 DIAGNOSIS — E785 Hyperlipidemia, unspecified: Secondary | ICD-10-CM | POA: Diagnosis not present

## 2022-06-14 DIAGNOSIS — I10 Essential (primary) hypertension: Secondary | ICD-10-CM | POA: Diagnosis not present

## 2022-06-14 DIAGNOSIS — I340 Nonrheumatic mitral (valve) insufficiency: Secondary | ICD-10-CM | POA: Diagnosis not present

## 2022-06-14 DIAGNOSIS — Z953 Presence of xenogenic heart valve: Secondary | ICD-10-CM | POA: Diagnosis not present

## 2022-06-14 DIAGNOSIS — R079 Chest pain, unspecified: Secondary | ICD-10-CM | POA: Diagnosis not present

## 2022-07-02 DIAGNOSIS — I480 Paroxysmal atrial fibrillation: Secondary | ICD-10-CM | POA: Diagnosis not present

## 2022-07-05 DIAGNOSIS — I4891 Unspecified atrial fibrillation: Secondary | ICD-10-CM | POA: Diagnosis not present

## 2022-07-05 DIAGNOSIS — I471 Supraventricular tachycardia: Secondary | ICD-10-CM | POA: Diagnosis not present

## 2022-07-05 DIAGNOSIS — I472 Ventricular tachycardia, unspecified: Secondary | ICD-10-CM | POA: Diagnosis not present

## 2022-07-28 DIAGNOSIS — E785 Hyperlipidemia, unspecified: Secondary | ICD-10-CM | POA: Diagnosis not present

## 2022-07-28 DIAGNOSIS — Z7901 Long term (current) use of anticoagulants: Secondary | ICD-10-CM | POA: Diagnosis not present

## 2022-07-28 DIAGNOSIS — I2510 Atherosclerotic heart disease of native coronary artery without angina pectoris: Secondary | ICD-10-CM | POA: Diagnosis not present

## 2022-07-28 DIAGNOSIS — I340 Nonrheumatic mitral (valve) insufficiency: Secondary | ICD-10-CM | POA: Diagnosis not present

## 2022-07-28 DIAGNOSIS — I480 Paroxysmal atrial fibrillation: Secondary | ICD-10-CM | POA: Diagnosis not present

## 2022-07-28 DIAGNOSIS — Z953 Presence of xenogenic heart valve: Secondary | ICD-10-CM | POA: Diagnosis not present

## 2022-07-28 DIAGNOSIS — I10 Essential (primary) hypertension: Secondary | ICD-10-CM | POA: Diagnosis not present

## 2022-07-28 DIAGNOSIS — R0789 Other chest pain: Secondary | ICD-10-CM | POA: Diagnosis not present

## 2022-09-08 DIAGNOSIS — D638 Anemia in other chronic diseases classified elsewhere: Secondary | ICD-10-CM | POA: Diagnosis not present

## 2022-09-08 DIAGNOSIS — E785 Hyperlipidemia, unspecified: Secondary | ICD-10-CM | POA: Diagnosis not present

## 2022-09-08 DIAGNOSIS — M109 Gout, unspecified: Secondary | ICD-10-CM | POA: Diagnosis not present

## 2022-09-08 DIAGNOSIS — I480 Paroxysmal atrial fibrillation: Secondary | ICD-10-CM | POA: Diagnosis not present

## 2022-09-08 DIAGNOSIS — N183 Chronic kidney disease, stage 3 unspecified: Secondary | ICD-10-CM | POA: Diagnosis not present

## 2022-09-08 DIAGNOSIS — I129 Hypertensive chronic kidney disease with stage 1 through stage 4 chronic kidney disease, or unspecified chronic kidney disease: Secondary | ICD-10-CM | POA: Diagnosis not present

## 2022-09-20 DIAGNOSIS — H47011 Ischemic optic neuropathy, right eye: Secondary | ICD-10-CM | POA: Diagnosis not present

## 2022-09-20 DIAGNOSIS — H5203 Hypermetropia, bilateral: Secondary | ICD-10-CM | POA: Diagnosis not present

## 2022-09-20 DIAGNOSIS — H25813 Combined forms of age-related cataract, bilateral: Secondary | ICD-10-CM | POA: Diagnosis not present

## 2022-09-20 DIAGNOSIS — H47012 Ischemic optic neuropathy, left eye: Secondary | ICD-10-CM | POA: Diagnosis not present

## 2022-09-24 DIAGNOSIS — D649 Anemia, unspecified: Secondary | ICD-10-CM | POA: Diagnosis not present

## 2022-09-24 DIAGNOSIS — Z1212 Encounter for screening for malignant neoplasm of rectum: Secondary | ICD-10-CM | POA: Diagnosis not present

## 2022-09-27 DIAGNOSIS — D649 Anemia, unspecified: Secondary | ICD-10-CM | POA: Diagnosis not present

## 2022-10-25 DIAGNOSIS — D649 Anemia, unspecified: Secondary | ICD-10-CM | POA: Diagnosis not present

## 2023-03-21 DIAGNOSIS — H47012 Ischemic optic neuropathy, left eye: Secondary | ICD-10-CM | POA: Diagnosis not present

## 2023-03-21 DIAGNOSIS — H47011 Ischemic optic neuropathy, right eye: Secondary | ICD-10-CM | POA: Diagnosis not present

## 2023-03-28 DIAGNOSIS — E785 Hyperlipidemia, unspecified: Secondary | ICD-10-CM | POA: Diagnosis not present

## 2023-03-28 DIAGNOSIS — I129 Hypertensive chronic kidney disease with stage 1 through stage 4 chronic kidney disease, or unspecified chronic kidney disease: Secondary | ICD-10-CM | POA: Diagnosis not present

## 2023-03-28 DIAGNOSIS — M109 Gout, unspecified: Secondary | ICD-10-CM | POA: Diagnosis not present

## 2023-03-28 DIAGNOSIS — I480 Paroxysmal atrial fibrillation: Secondary | ICD-10-CM | POA: Diagnosis not present

## 2023-03-28 DIAGNOSIS — D638 Anemia in other chronic diseases classified elsewhere: Secondary | ICD-10-CM | POA: Diagnosis not present

## 2023-03-28 DIAGNOSIS — N183 Chronic kidney disease, stage 3 unspecified: Secondary | ICD-10-CM | POA: Diagnosis not present

## 2023-04-01 DIAGNOSIS — N281 Cyst of kidney, acquired: Secondary | ICD-10-CM | POA: Diagnosis not present

## 2023-04-01 DIAGNOSIS — R944 Abnormal results of kidney function studies: Secondary | ICD-10-CM | POA: Diagnosis not present

## 2023-05-13 DIAGNOSIS — I129 Hypertensive chronic kidney disease with stage 1 through stage 4 chronic kidney disease, or unspecified chronic kidney disease: Secondary | ICD-10-CM | POA: Diagnosis not present

## 2023-05-13 DIAGNOSIS — R809 Proteinuria, unspecified: Secondary | ICD-10-CM | POA: Diagnosis not present

## 2023-05-13 DIAGNOSIS — R7303 Prediabetes: Secondary | ICD-10-CM | POA: Diagnosis not present

## 2023-05-13 DIAGNOSIS — N179 Acute kidney failure, unspecified: Secondary | ICD-10-CM | POA: Diagnosis not present

## 2023-05-13 DIAGNOSIS — I480 Paroxysmal atrial fibrillation: Secondary | ICD-10-CM | POA: Diagnosis not present

## 2023-05-13 DIAGNOSIS — N1832 Chronic kidney disease, stage 3b: Secondary | ICD-10-CM | POA: Diagnosis not present

## 2023-05-16 DIAGNOSIS — I480 Paroxysmal atrial fibrillation: Secondary | ICD-10-CM | POA: Diagnosis not present

## 2023-05-16 DIAGNOSIS — I340 Nonrheumatic mitral (valve) insufficiency: Secondary | ICD-10-CM | POA: Diagnosis not present

## 2023-05-16 DIAGNOSIS — I10 Essential (primary) hypertension: Secondary | ICD-10-CM | POA: Diagnosis not present

## 2023-05-16 DIAGNOSIS — Z953 Presence of xenogenic heart valve: Secondary | ICD-10-CM | POA: Diagnosis not present

## 2023-05-16 DIAGNOSIS — E785 Hyperlipidemia, unspecified: Secondary | ICD-10-CM | POA: Diagnosis not present

## 2023-05-16 DIAGNOSIS — I2510 Atherosclerotic heart disease of native coronary artery without angina pectoris: Secondary | ICD-10-CM | POA: Diagnosis not present

## 2023-05-16 DIAGNOSIS — Z8679 Personal history of other diseases of the circulatory system: Secondary | ICD-10-CM | POA: Diagnosis not present

## 2023-05-26 DIAGNOSIS — I10 Essential (primary) hypertension: Secondary | ICD-10-CM | POA: Diagnosis not present

## 2023-05-26 DIAGNOSIS — E7800 Pure hypercholesterolemia, unspecified: Secondary | ICD-10-CM | POA: Diagnosis not present

## 2023-05-26 DIAGNOSIS — G459 Transient cerebral ischemic attack, unspecified: Secondary | ICD-10-CM | POA: Diagnosis not present

## 2023-05-26 DIAGNOSIS — I700 Atherosclerosis of aorta: Secondary | ICD-10-CM | POA: Diagnosis not present

## 2023-05-26 DIAGNOSIS — R251 Tremor, unspecified: Secondary | ICD-10-CM | POA: Diagnosis not present

## 2023-05-26 DIAGNOSIS — I672 Cerebral atherosclerosis: Secondary | ICD-10-CM | POA: Diagnosis not present

## 2023-05-26 DIAGNOSIS — R9431 Abnormal electrocardiogram [ECG] [EKG]: Secondary | ICD-10-CM | POA: Diagnosis not present

## 2023-05-26 DIAGNOSIS — I4891 Unspecified atrial fibrillation: Secondary | ICD-10-CM | POA: Diagnosis not present

## 2023-05-31 DIAGNOSIS — I480 Paroxysmal atrial fibrillation: Secondary | ICD-10-CM | POA: Diagnosis not present

## 2023-05-31 DIAGNOSIS — G459 Transient cerebral ischemic attack, unspecified: Secondary | ICD-10-CM | POA: Diagnosis not present

## 2023-05-31 DIAGNOSIS — I129 Hypertensive chronic kidney disease with stage 1 through stage 4 chronic kidney disease, or unspecified chronic kidney disease: Secondary | ICD-10-CM | POA: Diagnosis not present

## 2023-05-31 DIAGNOSIS — E860 Dehydration: Secondary | ICD-10-CM | POA: Diagnosis not present

## 2023-06-08 DIAGNOSIS — I6523 Occlusion and stenosis of bilateral carotid arteries: Secondary | ICD-10-CM | POA: Diagnosis not present

## 2023-06-08 DIAGNOSIS — G459 Transient cerebral ischemic attack, unspecified: Secondary | ICD-10-CM | POA: Diagnosis not present

## 2023-06-08 DIAGNOSIS — G9389 Other specified disorders of brain: Secondary | ICD-10-CM | POA: Diagnosis not present

## 2023-06-15 DIAGNOSIS — Z953 Presence of xenogenic heart valve: Secondary | ICD-10-CM | POA: Diagnosis not present

## 2023-06-15 DIAGNOSIS — R002 Palpitations: Secondary | ICD-10-CM | POA: Diagnosis not present

## 2023-06-15 DIAGNOSIS — Z952 Presence of prosthetic heart valve: Secondary | ICD-10-CM | POA: Diagnosis not present

## 2023-06-15 DIAGNOSIS — I4891 Unspecified atrial fibrillation: Secondary | ICD-10-CM | POA: Diagnosis not present

## 2023-06-15 DIAGNOSIS — I340 Nonrheumatic mitral (valve) insufficiency: Secondary | ICD-10-CM | POA: Diagnosis not present

## 2023-06-27 DIAGNOSIS — R809 Proteinuria, unspecified: Secondary | ICD-10-CM | POA: Diagnosis not present

## 2023-06-27 DIAGNOSIS — N1832 Chronic kidney disease, stage 3b: Secondary | ICD-10-CM | POA: Diagnosis not present

## 2023-06-27 DIAGNOSIS — I129 Hypertensive chronic kidney disease with stage 1 through stage 4 chronic kidney disease, or unspecified chronic kidney disease: Secondary | ICD-10-CM | POA: Diagnosis not present

## 2023-06-27 DIAGNOSIS — N179 Acute kidney failure, unspecified: Secondary | ICD-10-CM | POA: Diagnosis not present

## 2023-06-27 DIAGNOSIS — R7303 Prediabetes: Secondary | ICD-10-CM | POA: Diagnosis not present

## 2023-06-27 DIAGNOSIS — I480 Paroxysmal atrial fibrillation: Secondary | ICD-10-CM | POA: Diagnosis not present

## 2023-07-13 DIAGNOSIS — N1832 Chronic kidney disease, stage 3b: Secondary | ICD-10-CM | POA: Diagnosis not present

## 2023-09-07 DIAGNOSIS — M47812 Spondylosis without myelopathy or radiculopathy, cervical region: Secondary | ICD-10-CM | POA: Diagnosis not present

## 2023-09-07 DIAGNOSIS — I700 Atherosclerosis of aorta: Secondary | ICD-10-CM | POA: Diagnosis not present

## 2023-09-07 DIAGNOSIS — M4802 Spinal stenosis, cervical region: Secondary | ICD-10-CM | POA: Diagnosis not present

## 2023-09-07 DIAGNOSIS — M5090 Cervical disc disorder, unspecified, unspecified cervical region: Secondary | ICD-10-CM | POA: Diagnosis not present

## 2023-09-07 DIAGNOSIS — M5030 Other cervical disc degeneration, unspecified cervical region: Secondary | ICD-10-CM | POA: Diagnosis not present

## 2023-09-08 DIAGNOSIS — N1832 Chronic kidney disease, stage 3b: Secondary | ICD-10-CM | POA: Diagnosis not present

## 2023-09-13 DIAGNOSIS — S62325A Displaced fracture of shaft of fourth metacarpal bone, left hand, initial encounter for closed fracture: Secondary | ICD-10-CM | POA: Diagnosis not present

## 2023-09-13 DIAGNOSIS — S52302A Unspecified fracture of shaft of left radius, initial encounter for closed fracture: Secondary | ICD-10-CM | POA: Diagnosis not present

## 2023-09-13 DIAGNOSIS — M79642 Pain in left hand: Secondary | ICD-10-CM | POA: Diagnosis not present

## 2023-09-13 DIAGNOSIS — S62327A Displaced fracture of shaft of fifth metacarpal bone, left hand, initial encounter for closed fracture: Secondary | ICD-10-CM | POA: Diagnosis not present

## 2023-09-15 DIAGNOSIS — M25542 Pain in joints of left hand: Secondary | ICD-10-CM | POA: Diagnosis not present

## 2023-09-21 DIAGNOSIS — Z7982 Long term (current) use of aspirin: Secondary | ICD-10-CM | POA: Diagnosis not present

## 2023-09-21 DIAGNOSIS — S62325A Displaced fracture of shaft of fourth metacarpal bone, left hand, initial encounter for closed fracture: Secondary | ICD-10-CM | POA: Diagnosis not present

## 2023-09-21 DIAGNOSIS — Z86718 Personal history of other venous thrombosis and embolism: Secondary | ICD-10-CM | POA: Diagnosis not present

## 2023-09-21 DIAGNOSIS — I4891 Unspecified atrial fibrillation: Secondary | ICD-10-CM | POA: Diagnosis not present

## 2023-09-21 DIAGNOSIS — N183 Chronic kidney disease, stage 3 unspecified: Secondary | ICD-10-CM | POA: Diagnosis not present

## 2023-09-21 DIAGNOSIS — E785 Hyperlipidemia, unspecified: Secondary | ICD-10-CM | POA: Diagnosis not present

## 2023-09-21 DIAGNOSIS — S62397A Other fracture of fifth metacarpal bone, left hand, initial encounter for closed fracture: Secondary | ICD-10-CM | POA: Diagnosis not present

## 2023-09-21 DIAGNOSIS — H903 Sensorineural hearing loss, bilateral: Secondary | ICD-10-CM | POA: Diagnosis not present

## 2023-09-21 DIAGNOSIS — I129 Hypertensive chronic kidney disease with stage 1 through stage 4 chronic kidney disease, or unspecified chronic kidney disease: Secondary | ICD-10-CM | POA: Diagnosis not present

## 2023-09-21 DIAGNOSIS — Z954 Presence of other heart-valve replacement: Secondary | ICD-10-CM | POA: Diagnosis not present

## 2023-09-21 DIAGNOSIS — Z79899 Other long term (current) drug therapy: Secondary | ICD-10-CM | POA: Diagnosis not present

## 2023-09-21 DIAGNOSIS — S62305A Unspecified fracture of fourth metacarpal bone, left hand, initial encounter for closed fracture: Secondary | ICD-10-CM | POA: Diagnosis not present

## 2023-09-21 DIAGNOSIS — S62307A Unspecified fracture of fifth metacarpal bone, left hand, initial encounter for closed fracture: Secondary | ICD-10-CM | POA: Diagnosis not present

## 2023-09-21 DIAGNOSIS — Z7901 Long term (current) use of anticoagulants: Secondary | ICD-10-CM | POA: Diagnosis not present

## 2023-09-21 DIAGNOSIS — S62395A Other fracture of fourth metacarpal bone, left hand, initial encounter for closed fracture: Secondary | ICD-10-CM | POA: Diagnosis not present

## 2023-09-21 DIAGNOSIS — I2510 Atherosclerotic heart disease of native coronary artery without angina pectoris: Secondary | ICD-10-CM | POA: Diagnosis not present

## 2023-09-21 DIAGNOSIS — M109 Gout, unspecified: Secondary | ICD-10-CM | POA: Diagnosis not present

## 2023-09-21 DIAGNOSIS — M1990 Unspecified osteoarthritis, unspecified site: Secondary | ICD-10-CM | POA: Diagnosis not present

## 2023-09-21 DIAGNOSIS — D638 Anemia in other chronic diseases classified elsewhere: Secondary | ICD-10-CM | POA: Diagnosis not present

## 2023-09-28 DIAGNOSIS — N1832 Chronic kidney disease, stage 3b: Secondary | ICD-10-CM | POA: Diagnosis not present

## 2023-09-28 DIAGNOSIS — E785 Hyperlipidemia, unspecified: Secondary | ICD-10-CM | POA: Diagnosis not present

## 2023-09-28 DIAGNOSIS — M109 Gout, unspecified: Secondary | ICD-10-CM | POA: Diagnosis not present

## 2023-09-28 DIAGNOSIS — I480 Paroxysmal atrial fibrillation: Secondary | ICD-10-CM | POA: Diagnosis not present

## 2023-09-28 DIAGNOSIS — I129 Hypertensive chronic kidney disease with stage 1 through stage 4 chronic kidney disease, or unspecified chronic kidney disease: Secondary | ICD-10-CM | POA: Diagnosis not present

## 2023-09-28 DIAGNOSIS — R413 Other amnesia: Secondary | ICD-10-CM | POA: Diagnosis not present

## 2023-09-28 DIAGNOSIS — D638 Anemia in other chronic diseases classified elsewhere: Secondary | ICD-10-CM | POA: Diagnosis not present

## 2023-09-29 DIAGNOSIS — H47012 Ischemic optic neuropathy, left eye: Secondary | ICD-10-CM | POA: Diagnosis not present

## 2023-09-29 DIAGNOSIS — H47011 Ischemic optic neuropathy, right eye: Secondary | ICD-10-CM | POA: Diagnosis not present

## 2023-09-29 DIAGNOSIS — H524 Presbyopia: Secondary | ICD-10-CM | POA: Diagnosis not present

## 2023-12-02 DIAGNOSIS — I2510 Atherosclerotic heart disease of native coronary artery without angina pectoris: Secondary | ICD-10-CM | POA: Diagnosis not present

## 2023-12-02 DIAGNOSIS — Z8679 Personal history of other diseases of the circulatory system: Secondary | ICD-10-CM | POA: Diagnosis not present

## 2023-12-02 DIAGNOSIS — Z953 Presence of xenogenic heart valve: Secondary | ICD-10-CM | POA: Diagnosis not present

## 2023-12-02 DIAGNOSIS — I10 Essential (primary) hypertension: Secondary | ICD-10-CM | POA: Diagnosis not present

## 2023-12-02 DIAGNOSIS — Z7901 Long term (current) use of anticoagulants: Secondary | ICD-10-CM | POA: Diagnosis not present

## 2023-12-02 DIAGNOSIS — I480 Paroxysmal atrial fibrillation: Secondary | ICD-10-CM | POA: Diagnosis not present

## 2023-12-02 DIAGNOSIS — I340 Nonrheumatic mitral (valve) insufficiency: Secondary | ICD-10-CM | POA: Diagnosis not present

## 2023-12-02 DIAGNOSIS — E785 Hyperlipidemia, unspecified: Secondary | ICD-10-CM | POA: Diagnosis not present

## 2023-12-08 DIAGNOSIS — I4891 Unspecified atrial fibrillation: Secondary | ICD-10-CM | POA: Diagnosis not present

## 2023-12-08 DIAGNOSIS — I472 Ventricular tachycardia, unspecified: Secondary | ICD-10-CM | POA: Diagnosis not present

## 2023-12-08 DIAGNOSIS — I480 Paroxysmal atrial fibrillation: Secondary | ICD-10-CM | POA: Diagnosis not present

## 2023-12-08 DIAGNOSIS — I495 Sick sinus syndrome: Secondary | ICD-10-CM | POA: Diagnosis not present

## 2023-12-22 DIAGNOSIS — I480 Paroxysmal atrial fibrillation: Secondary | ICD-10-CM | POA: Diagnosis not present

## 2023-12-23 DIAGNOSIS — I4891 Unspecified atrial fibrillation: Secondary | ICD-10-CM | POA: Diagnosis not present

## 2023-12-23 DIAGNOSIS — I472 Ventricular tachycardia, unspecified: Secondary | ICD-10-CM | POA: Diagnosis not present

## 2023-12-23 DIAGNOSIS — I495 Sick sinus syndrome: Secondary | ICD-10-CM | POA: Diagnosis not present

## 2023-12-27 DIAGNOSIS — I480 Paroxysmal atrial fibrillation: Secondary | ICD-10-CM | POA: Diagnosis not present

## 2023-12-27 DIAGNOSIS — R531 Weakness: Secondary | ICD-10-CM | POA: Diagnosis not present

## 2023-12-27 DIAGNOSIS — Z7901 Long term (current) use of anticoagulants: Secondary | ICD-10-CM | POA: Diagnosis not present

## 2023-12-27 DIAGNOSIS — Z953 Presence of xenogenic heart valve: Secondary | ICD-10-CM | POA: Diagnosis not present

## 2023-12-27 DIAGNOSIS — Z8679 Personal history of other diseases of the circulatory system: Secondary | ICD-10-CM | POA: Diagnosis not present

## 2023-12-27 DIAGNOSIS — E782 Mixed hyperlipidemia: Secondary | ICD-10-CM | POA: Diagnosis not present

## 2023-12-27 DIAGNOSIS — I4819 Other persistent atrial fibrillation: Secondary | ICD-10-CM | POA: Diagnosis not present

## 2023-12-27 DIAGNOSIS — Q2381 Bicuspid aortic valve: Secondary | ICD-10-CM | POA: Diagnosis not present

## 2023-12-27 DIAGNOSIS — I484 Atypical atrial flutter: Secondary | ICD-10-CM | POA: Diagnosis not present

## 2023-12-28 DIAGNOSIS — I4891 Unspecified atrial fibrillation: Secondary | ICD-10-CM | POA: Diagnosis not present

## 2024-01-09 DIAGNOSIS — Z9181 History of falling: Secondary | ICD-10-CM | POA: Diagnosis not present

## 2024-01-09 DIAGNOSIS — Z0000 Encounter for general adult medical examination without abnormal findings: Secondary | ICD-10-CM | POA: Diagnosis not present

## 2024-01-11 DIAGNOSIS — M109 Gout, unspecified: Secondary | ICD-10-CM | POA: Diagnosis not present

## 2024-01-11 DIAGNOSIS — E785 Hyperlipidemia, unspecified: Secondary | ICD-10-CM | POA: Diagnosis not present

## 2024-01-11 DIAGNOSIS — D638 Anemia in other chronic diseases classified elsewhere: Secondary | ICD-10-CM | POA: Diagnosis not present

## 2024-01-11 DIAGNOSIS — I480 Paroxysmal atrial fibrillation: Secondary | ICD-10-CM | POA: Diagnosis not present

## 2024-01-11 DIAGNOSIS — R413 Other amnesia: Secondary | ICD-10-CM | POA: Diagnosis not present

## 2024-01-11 DIAGNOSIS — N1832 Chronic kidney disease, stage 3b: Secondary | ICD-10-CM | POA: Diagnosis not present

## 2024-01-11 DIAGNOSIS — I129 Hypertensive chronic kidney disease with stage 1 through stage 4 chronic kidney disease, or unspecified chronic kidney disease: Secondary | ICD-10-CM | POA: Diagnosis not present

## 2024-02-03 DIAGNOSIS — I700 Atherosclerosis of aorta: Secondary | ICD-10-CM | POA: Diagnosis not present

## 2024-02-03 DIAGNOSIS — I480 Paroxysmal atrial fibrillation: Secondary | ICD-10-CM | POA: Diagnosis not present

## 2024-02-03 DIAGNOSIS — I4892 Unspecified atrial flutter: Secondary | ICD-10-CM | POA: Diagnosis not present

## 2024-02-03 DIAGNOSIS — I081 Rheumatic disorders of both mitral and tricuspid valves: Secondary | ICD-10-CM | POA: Diagnosis not present

## 2024-02-06 DIAGNOSIS — N183 Chronic kidney disease, stage 3 unspecified: Secondary | ICD-10-CM | POA: Diagnosis not present

## 2024-02-06 DIAGNOSIS — E785 Hyperlipidemia, unspecified: Secondary | ICD-10-CM | POA: Diagnosis not present

## 2024-02-06 DIAGNOSIS — I4891 Unspecified atrial fibrillation: Secondary | ICD-10-CM | POA: Diagnosis not present

## 2024-02-06 DIAGNOSIS — R9431 Abnormal electrocardiogram [ECG] [EKG]: Secondary | ICD-10-CM | POA: Diagnosis not present

## 2024-02-06 DIAGNOSIS — I129 Hypertensive chronic kidney disease with stage 1 through stage 4 chronic kidney disease, or unspecified chronic kidney disease: Secondary | ICD-10-CM | POA: Diagnosis not present

## 2024-02-06 DIAGNOSIS — Z7901 Long term (current) use of anticoagulants: Secondary | ICD-10-CM | POA: Diagnosis not present

## 2024-02-06 DIAGNOSIS — I499 Cardiac arrhythmia, unspecified: Secondary | ICD-10-CM | POA: Diagnosis not present

## 2024-02-06 DIAGNOSIS — I4819 Other persistent atrial fibrillation: Secondary | ICD-10-CM | POA: Diagnosis not present

## 2024-02-06 DIAGNOSIS — Z952 Presence of prosthetic heart valve: Secondary | ICD-10-CM | POA: Diagnosis not present

## 2024-02-07 DIAGNOSIS — Z7901 Long term (current) use of anticoagulants: Secondary | ICD-10-CM | POA: Diagnosis not present

## 2024-02-07 DIAGNOSIS — Z952 Presence of prosthetic heart valve: Secondary | ICD-10-CM | POA: Diagnosis not present

## 2024-02-07 DIAGNOSIS — R9431 Abnormal electrocardiogram [ECG] [EKG]: Secondary | ICD-10-CM | POA: Diagnosis not present

## 2024-02-07 DIAGNOSIS — N183 Chronic kidney disease, stage 3 unspecified: Secondary | ICD-10-CM | POA: Diagnosis not present

## 2024-02-07 DIAGNOSIS — I4819 Other persistent atrial fibrillation: Secondary | ICD-10-CM | POA: Diagnosis not present

## 2024-02-07 DIAGNOSIS — I129 Hypertensive chronic kidney disease with stage 1 through stage 4 chronic kidney disease, or unspecified chronic kidney disease: Secondary | ICD-10-CM | POA: Diagnosis not present

## 2024-02-07 DIAGNOSIS — E785 Hyperlipidemia, unspecified: Secondary | ICD-10-CM | POA: Diagnosis not present

## 2024-03-17 DIAGNOSIS — I129 Hypertensive chronic kidney disease with stage 1 through stage 4 chronic kidney disease, or unspecified chronic kidney disease: Secondary | ICD-10-CM | POA: Diagnosis not present

## 2024-04-05 DIAGNOSIS — I129 Hypertensive chronic kidney disease with stage 1 through stage 4 chronic kidney disease, or unspecified chronic kidney disease: Secondary | ICD-10-CM | POA: Diagnosis not present

## 2024-04-06 DIAGNOSIS — M1712 Unilateral primary osteoarthritis, left knee: Secondary | ICD-10-CM | POA: Diagnosis not present

## 2024-04-16 DIAGNOSIS — H524 Presbyopia: Secondary | ICD-10-CM | POA: Diagnosis not present

## 2024-04-16 DIAGNOSIS — H47012 Ischemic optic neuropathy, left eye: Secondary | ICD-10-CM | POA: Diagnosis not present

## 2024-05-17 DIAGNOSIS — I129 Hypertensive chronic kidney disease with stage 1 through stage 4 chronic kidney disease, or unspecified chronic kidney disease: Secondary | ICD-10-CM | POA: Diagnosis not present

## 2024-05-24 DIAGNOSIS — Z8679 Personal history of other diseases of the circulatory system: Secondary | ICD-10-CM | POA: Diagnosis not present

## 2024-05-24 DIAGNOSIS — Z7901 Long term (current) use of anticoagulants: Secondary | ICD-10-CM | POA: Diagnosis not present

## 2024-05-24 DIAGNOSIS — Z953 Presence of xenogenic heart valve: Secondary | ICD-10-CM | POA: Diagnosis not present

## 2024-05-24 DIAGNOSIS — I2510 Atherosclerotic heart disease of native coronary artery without angina pectoris: Secondary | ICD-10-CM | POA: Diagnosis not present

## 2024-05-24 DIAGNOSIS — Z9889 Other specified postprocedural states: Secondary | ICD-10-CM | POA: Diagnosis not present

## 2024-05-24 DIAGNOSIS — I484 Atypical atrial flutter: Secondary | ICD-10-CM | POA: Diagnosis not present

## 2024-05-24 DIAGNOSIS — I4819 Other persistent atrial fibrillation: Secondary | ICD-10-CM | POA: Diagnosis not present

## 2024-05-24 DIAGNOSIS — Q2381 Bicuspid aortic valve: Secondary | ICD-10-CM | POA: Diagnosis not present

## 2024-05-25 DIAGNOSIS — R002 Palpitations: Secondary | ICD-10-CM | POA: Diagnosis not present

## 2024-05-25 DIAGNOSIS — R001 Bradycardia, unspecified: Secondary | ICD-10-CM | POA: Diagnosis not present

## 2024-06-08 DIAGNOSIS — S86011A Strain of right Achilles tendon, initial encounter: Secondary | ICD-10-CM | POA: Diagnosis not present

## 2024-06-08 DIAGNOSIS — M25571 Pain in right ankle and joints of right foot: Secondary | ICD-10-CM | POA: Diagnosis not present

## 2024-06-08 DIAGNOSIS — M7751 Other enthesopathy of right foot: Secondary | ICD-10-CM | POA: Diagnosis not present

## 2024-06-08 DIAGNOSIS — M19071 Primary osteoarthritis, right ankle and foot: Secondary | ICD-10-CM | POA: Diagnosis not present

## 2024-06-19 DIAGNOSIS — M19071 Primary osteoarthritis, right ankle and foot: Secondary | ICD-10-CM | POA: Diagnosis not present

## 2024-06-20 DIAGNOSIS — M19071 Primary osteoarthritis, right ankle and foot: Secondary | ICD-10-CM | POA: Diagnosis not present

## 2024-06-27 DIAGNOSIS — M7750 Other enthesopathy of unspecified foot: Secondary | ICD-10-CM | POA: Diagnosis not present

## 2024-06-27 DIAGNOSIS — M1990 Unspecified osteoarthritis, unspecified site: Secondary | ICD-10-CM | POA: Diagnosis not present

## 2024-06-27 DIAGNOSIS — M7660 Achilles tendinitis, unspecified leg: Secondary | ICD-10-CM | POA: Diagnosis not present

## 2024-06-27 DIAGNOSIS — M79671 Pain in right foot: Secondary | ICD-10-CM | POA: Diagnosis not present

## 2024-07-05 DIAGNOSIS — R072 Precordial pain: Secondary | ICD-10-CM | POA: Diagnosis not present

## 2024-07-05 DIAGNOSIS — I2510 Atherosclerotic heart disease of native coronary artery without angina pectoris: Secondary | ICD-10-CM | POA: Diagnosis not present

## 2024-07-05 DIAGNOSIS — I499 Cardiac arrhythmia, unspecified: Secondary | ICD-10-CM | POA: Diagnosis not present

## 2024-07-05 DIAGNOSIS — I340 Nonrheumatic mitral (valve) insufficiency: Secondary | ICD-10-CM | POA: Diagnosis not present

## 2024-07-05 DIAGNOSIS — I480 Paroxysmal atrial fibrillation: Secondary | ICD-10-CM | POA: Diagnosis not present

## 2024-07-05 DIAGNOSIS — Z953 Presence of xenogenic heart valve: Secondary | ICD-10-CM | POA: Diagnosis not present

## 2024-07-05 DIAGNOSIS — Z9889 Other specified postprocedural states: Secondary | ICD-10-CM | POA: Diagnosis not present

## 2024-07-05 DIAGNOSIS — Z8679 Personal history of other diseases of the circulatory system: Secondary | ICD-10-CM | POA: Diagnosis not present

## 2024-07-05 DIAGNOSIS — Z7901 Long term (current) use of anticoagulants: Secondary | ICD-10-CM | POA: Diagnosis not present

## 2024-07-05 DIAGNOSIS — E782 Mixed hyperlipidemia: Secondary | ICD-10-CM | POA: Diagnosis not present

## 2024-07-10 DIAGNOSIS — R413 Other amnesia: Secondary | ICD-10-CM | POA: Diagnosis not present

## 2024-07-10 DIAGNOSIS — I129 Hypertensive chronic kidney disease with stage 1 through stage 4 chronic kidney disease, or unspecified chronic kidney disease: Secondary | ICD-10-CM | POA: Diagnosis not present

## 2024-07-10 DIAGNOSIS — M109 Gout, unspecified: Secondary | ICD-10-CM | POA: Diagnosis not present

## 2024-07-10 DIAGNOSIS — D638 Anemia in other chronic diseases classified elsewhere: Secondary | ICD-10-CM | POA: Diagnosis not present

## 2024-07-10 DIAGNOSIS — I480 Paroxysmal atrial fibrillation: Secondary | ICD-10-CM | POA: Diagnosis not present

## 2024-07-10 DIAGNOSIS — E785 Hyperlipidemia, unspecified: Secondary | ICD-10-CM | POA: Diagnosis not present

## 2024-07-10 DIAGNOSIS — N1832 Chronic kidney disease, stage 3b: Secondary | ICD-10-CM | POA: Diagnosis not present

## 2024-07-17 DIAGNOSIS — M1712 Unilateral primary osteoarthritis, left knee: Secondary | ICD-10-CM | POA: Diagnosis not present

## 2024-08-06 DIAGNOSIS — R1901 Right upper quadrant abdominal swelling, mass and lump: Secondary | ICD-10-CM | POA: Diagnosis not present

## 2024-08-09 DIAGNOSIS — R1901 Right upper quadrant abdominal swelling, mass and lump: Secondary | ICD-10-CM | POA: Diagnosis not present
